# Patient Record
Sex: Female | Born: 1960 | Race: Black or African American | Hispanic: No | Marital: Married | Smoking: Never smoker
Health system: Southern US, Community
[De-identification: ages and names within clinical notes are randomized; demographics above are authoritative.]

## PROBLEM LIST (undated history)

## (undated) DIAGNOSIS — N950 Postmenopausal bleeding: Secondary | ICD-10-CM

## (undated) DIAGNOSIS — R7303 Prediabetes: Secondary | ICD-10-CM

## (undated) DIAGNOSIS — Z973 Presence of spectacles and contact lenses: Secondary | ICD-10-CM

## (undated) DIAGNOSIS — M1990 Unspecified osteoarthritis, unspecified site: Secondary | ICD-10-CM

## (undated) DIAGNOSIS — G8929 Other chronic pain: Secondary | ICD-10-CM

## (undated) DIAGNOSIS — I10 Essential (primary) hypertension: Secondary | ICD-10-CM

## (undated) DIAGNOSIS — D259 Leiomyoma of uterus, unspecified: Secondary | ICD-10-CM

## (undated) DIAGNOSIS — K219 Gastro-esophageal reflux disease without esophagitis: Secondary | ICD-10-CM

## (undated) DIAGNOSIS — T7840XA Allergy, unspecified, initial encounter: Secondary | ICD-10-CM

## (undated) DIAGNOSIS — R9389 Abnormal findings on diagnostic imaging of other specified body structures: Secondary | ICD-10-CM

## (undated) HISTORY — DX: Allergy, unspecified, initial encounter: T78.40XA

---

## 1999-05-12 ENCOUNTER — Other Ambulatory Visit: Admission: RE | Admit: 1999-05-12 | Discharge: 1999-05-12 | Payer: Self-pay | Admitting: Obstetrics and Gynecology

## 1999-06-08 ENCOUNTER — Ambulatory Visit (HOSPITAL_COMMUNITY): Admission: RE | Admit: 1999-06-08 | Discharge: 1999-06-08 | Payer: Self-pay | Admitting: Obstetrics and Gynecology

## 1999-06-08 ENCOUNTER — Encounter: Payer: Self-pay | Admitting: Obstetrics and Gynecology

## 2000-06-05 ENCOUNTER — Other Ambulatory Visit: Admission: RE | Admit: 2000-06-05 | Discharge: 2000-06-05 | Payer: Self-pay | Admitting: Obstetrics and Gynecology

## 2002-01-06 ENCOUNTER — Other Ambulatory Visit: Admission: RE | Admit: 2002-01-06 | Discharge: 2002-01-06 | Payer: Self-pay | Admitting: Obstetrics and Gynecology

## 2003-04-17 ENCOUNTER — Ambulatory Visit (HOSPITAL_COMMUNITY): Admission: RE | Admit: 2003-04-17 | Discharge: 2003-04-17 | Payer: Self-pay | Admitting: Obstetrics and Gynecology

## 2003-04-17 ENCOUNTER — Encounter: Payer: Self-pay | Admitting: Obstetrics and Gynecology

## 2003-04-22 ENCOUNTER — Other Ambulatory Visit: Admission: RE | Admit: 2003-04-22 | Discharge: 2003-04-22 | Payer: Self-pay | Admitting: Obstetrics and Gynecology

## 2003-11-17 ENCOUNTER — Emergency Department (HOSPITAL_COMMUNITY): Admission: AD | Admit: 2003-11-17 | Discharge: 2003-11-17 | Payer: Self-pay | Admitting: Family Medicine

## 2003-11-22 ENCOUNTER — Emergency Department (HOSPITAL_COMMUNITY): Admission: AD | Admit: 2003-11-22 | Discharge: 2003-11-22 | Payer: Self-pay | Admitting: Family Medicine

## 2003-11-24 ENCOUNTER — Emergency Department (HOSPITAL_COMMUNITY): Admission: AD | Admit: 2003-11-24 | Discharge: 2003-11-24 | Payer: Self-pay | Admitting: Family Medicine

## 2004-04-20 ENCOUNTER — Other Ambulatory Visit: Admission: RE | Admit: 2004-04-20 | Discharge: 2004-04-20 | Payer: Self-pay | Admitting: Obstetrics and Gynecology

## 2004-04-27 ENCOUNTER — Ambulatory Visit (HOSPITAL_COMMUNITY): Admission: RE | Admit: 2004-04-27 | Discharge: 2004-04-27 | Payer: Self-pay | Admitting: Obstetrics and Gynecology

## 2005-04-27 ENCOUNTER — Encounter (INDEPENDENT_AMBULATORY_CARE_PROVIDER_SITE_OTHER): Payer: Self-pay | Admitting: *Deleted

## 2005-04-27 ENCOUNTER — Ambulatory Visit (HOSPITAL_COMMUNITY): Admission: RE | Admit: 2005-04-27 | Discharge: 2005-04-27 | Payer: Self-pay | Admitting: Obstetrics and Gynecology

## 2005-05-11 ENCOUNTER — Other Ambulatory Visit: Admission: RE | Admit: 2005-05-11 | Discharge: 2005-05-11 | Payer: Self-pay | Admitting: Obstetrics and Gynecology

## 2005-05-31 ENCOUNTER — Ambulatory Visit (HOSPITAL_COMMUNITY): Admission: RE | Admit: 2005-05-31 | Discharge: 2005-05-31 | Payer: Self-pay | Admitting: Obstetrics and Gynecology

## 2005-08-16 ENCOUNTER — Other Ambulatory Visit: Admission: RE | Admit: 2005-08-16 | Discharge: 2005-08-16 | Payer: Self-pay | Admitting: Obstetrics and Gynecology

## 2006-05-23 ENCOUNTER — Other Ambulatory Visit: Admission: RE | Admit: 2006-05-23 | Discharge: 2006-05-23 | Payer: Self-pay | Admitting: Obstetrics and Gynecology

## 2008-10-27 ENCOUNTER — Ambulatory Visit (HOSPITAL_COMMUNITY): Admission: RE | Admit: 2008-10-27 | Discharge: 2008-10-27 | Payer: Self-pay | Admitting: Obstetrics and Gynecology

## 2010-12-30 ENCOUNTER — Ambulatory Visit (HOSPITAL_COMMUNITY)
Admission: RE | Admit: 2010-12-30 | Discharge: 2010-12-30 | Payer: Self-pay | Source: Home / Self Care | Attending: Obstetrics and Gynecology | Admitting: Obstetrics and Gynecology

## 2011-01-14 ENCOUNTER — Encounter: Payer: Self-pay | Admitting: Obstetrics and Gynecology

## 2011-05-12 NOTE — Op Note (Signed)
NAMEQUINLYNN, CUTHBERT NO.:  [id number]   MEDICAL RECORD NO.:  [id number]          PATIENT TYPE:  AMB   LOCATION:  SDC                           FACILITY:  [facility]   PHYSICIAN:  Janine Limbo, M.D.DATE OF BIRTH:  1961-11-22   DATE OF PROCEDURE:  DATE OF DISCHARGE:                                 OPERATIVE REPORT   PREOPERATIVE DIAGNOSES:  1.  Fibroid uterus.  2.  Menorrhagia.  3.  Endometrial polyps.   POSTOPERATIVE DIAGNOSES:  1.  Fibroid uterus.  2.  Menorrhagia.  3.  Endometrial polyps.   PROCEDURE:  Hysteroscopy with resection of endometrial polyps, dilatation  and curettage.   SURGEON:  Janine Limbo, M.D.   ANESTHETIC:  General.   DISPOSITION:  Ms. Nathanson is a 50 year old female who presents with the  above diagnosis. She understands the indications for procedure and she  accepts the risk of, but not limited to, anesthetic complications, bleeding,  infection, and possible damage to surrounding organs.   FINDINGS:  There was a 12-week size fibroid uterus present. The patient had  several endometrial polyps present. There were no lesions suspicious for  malignancy. No adnexal masses were appreciated.   PROCEDURE:  The patient was taken to the operating room where general  anesthetic was given. The patient's abdomen, perineum, and vagina were  prepped with multiple layers of Betadine. A Foley catheter was placed in the  bladder. Examination under anesthesia was performed. The patient was  sterilely draped. A paracervical block was placed using 10 mL of 0.5%  Marcaine with epinephrine. An endocervical curettage was obtained. The  uterus sounded to 8.5 cm. The cervix was dilated. The diagnostic  hysteroscope was inserted and the cavity was carefully inspected. Pictures  were taken. Findings are mentioned above. The diagnostic hysteroscope was  removed and the cervix was dilated further. The operative hysteroscope was  then inserted. The  single loop resection wire was then used to remove the  polyps. No other suspicious lesions were seen. The cavity was then sharply  curetted. The cavity was felt to be clean at the end of our procedure.  Hemostasis was adequate. Pictures were once again taken to the patient's  endometrial cavity to show that the cavity was clean. All instruments were  then removed. Exam was repeated and the uterus was noted to be firm. Sponge,  needle and instrument counts were correct on two occasions. The estimated  blood loss was 10 mL. The estimated fluid deficit was 50 mL. The patient was  awakened from her anesthetic and taken to the recovery room in stable  condition. The Foley catheter was removed in the operating room.   FOLLOW-UP INSTRUCTIONS:  The patient will use ibuprofen 600 mg every six  hours as needed for pain. She also use Vicodin one or two tablets every four  hours as needed for pain. She will return to see Dr. Stefano Gaul and two to  three weeks of follow-up examination. She was given a copy of the  postoperative instruction sheet as prepared by the [facility] for patients who have undergone  a dilatation and curettage.      AVS/MEDQ  D:  04/27/2005  T:  04/27/2005  Job:  [id number]

## 2011-05-12 NOTE — H&P (Signed)
NAME:  [name], [name] NO.:  [id number]   MEDICAL RECORD NO.:  [id number]          PATIENT TYPE:  AMB   LOCATION:  SDC                           [facility]ACILITY:  [facility]   PHYSICIAN:  Janine Limbo, M.D.DATE OF BIRTH:  1961/02/16   DATE OF ADMISSION:  DATE OF DISCHARGE:                                HISTORY & PHYSICAL   HISTORY OF PRESENT ILLNESS:  Ms. [name] is a 50 year old female gravida 0  who presents for hysteroscopy with dilatation and curettage because of  menorrhagia, fibroids, and an endometrial polyp. The patient had a  hydrosonogram performed and she was found to have an 8.4 mm polyp within the  endometrial cavity. The patient's most recent Pap smear was within normal  limits. The patient's past gynecologic history is significant for a  laparoscopy in 1995 because of pelvic pain. She was found to have  endometriosis. She was also noted to have a fibroid uterus.   PAST MEDICAL HISTORY:  The patient denies hypertension and diabetes.   SOCIAL HISTORY:  The patient denies cigarette use, alcohol use, and  recreational drug use.   DRUG ALLERGIES:  The patient reports that she is allergic to [facility].   REVIEW OF SYSTEMS:  Please see history of present illness.   FAMILY HISTORY:  The patient's mother has had a stroke. There is also a  history of strokes in her grandparents. Hypertension and cerebrovascular  disease are present in her grandparents.   PHYSICAL EXAMINATION:  VITAL SIGNS:  Weight is 167 pounds.  HEENT:  Within normal limits.  CHEST:  Clear.  HEART:  Regular rate and rhythm.  BREASTS:  Without masses.  ABDOMEN:  Nontender.  EXTREMITIES:  Within normal limits  NEUROLOGIC:  Grossly normal.  PELVIC:  External genitalia is normal, the vagina is normal, the cervix is  nontender. The uterus is 8-10 weeks size and nontender, adnexa no masses,  and rectovaginal exam confirms.   ASSESSMENT:  1.  Menorrhagia.  2.  Fibroids.  3.  Endometrial  polyp.   PLAN:  The patient will undergo hysteroscopy with resection of an  endometrial polyp. She will also have a dilatation and curettage. She  understands the indications for her procedure, and she accepts the risks of,  but not limited to, anesthetic complications, bleeding, infections, and  possible damage to the surrounding organs.      AVS/MEDQ  D:  04/26/2005  T:  04/26/2005  Job:  [id number]

## 2012-11-20 ENCOUNTER — Ambulatory Visit: Payer: Self-pay | Admitting: Obstetrics and Gynecology

## 2012-12-02 ENCOUNTER — Other Ambulatory Visit: Payer: Self-pay | Admitting: Obstetrics and Gynecology

## 2012-12-02 DIAGNOSIS — Z1231 Encounter for screening mammogram for malignant neoplasm of breast: Secondary | ICD-10-CM

## 2012-12-20 ENCOUNTER — Ambulatory Visit: Payer: BC Managed Care – PPO

## 2012-12-20 ENCOUNTER — Ambulatory Visit (INDEPENDENT_AMBULATORY_CARE_PROVIDER_SITE_OTHER): Payer: BC Managed Care – PPO | Admitting: Family Medicine

## 2012-12-20 ENCOUNTER — Ambulatory Visit (HOSPITAL_COMMUNITY)
Admission: RE | Admit: 2012-12-20 | Discharge: 2012-12-20 | Disposition: A | Discharge status: Home / Self Care | Payer: BC Managed Care – PPO | Source: Ambulatory Visit | Attending: Obstetrics and Gynecology | Admitting: Obstetrics and Gynecology

## 2012-12-20 VITALS — BP 130/83 | HR 69 | Temp 98.0°F | Resp 16 | Ht 69.0 in | Wt 220.0 lb

## 2012-12-20 DIAGNOSIS — M79671 Pain in right foot: Secondary | ICD-10-CM

## 2012-12-20 DIAGNOSIS — J3489 Other specified disorders of nose and nasal sinuses: Secondary | ICD-10-CM

## 2012-12-20 DIAGNOSIS — M79609 Pain in unspecified limb: Secondary | ICD-10-CM

## 2012-12-20 DIAGNOSIS — Z23 Encounter for immunization: Secondary | ICD-10-CM

## 2012-12-20 DIAGNOSIS — E669 Obesity, unspecified: Secondary | ICD-10-CM | POA: Insufficient documentation

## 2012-12-20 DIAGNOSIS — R0981 Nasal congestion: Secondary | ICD-10-CM

## 2012-12-20 DIAGNOSIS — E785 Hyperlipidemia, unspecified: Secondary | ICD-10-CM

## 2012-12-20 DIAGNOSIS — Z1231 Encounter for screening mammogram for malignant neoplasm of breast: Secondary | ICD-10-CM | POA: Insufficient documentation

## 2012-12-20 LAB — POCT CBC
Granulocyte percent: 48.1 %G (ref 37–80)
HCT, POC: 43.8 % (ref 37.7–47.9)
Hemoglobin: 13.6 g/dL (ref 12.2–16.2)
Lymph, poc: 2.6 (ref 0.6–3.4)
MCH, POC: 28.8 pg (ref 27–31.2)
MCHC: 31.1 g/dL — AB (ref 31.8–35.4)
MCV: 92.7 fL (ref 80–97)
MID (cbc): 0.4 (ref 0–0.9)
MPV: 8.3 fL (ref 0–99.8)
POC Granulocyte: 2.7 (ref 2–6.9)
POC LYMPH PERCENT: 45.2 %L (ref 10–50)
POC MID %: 6.7 %M (ref 0–12)
Platelet Count, POC: 340 10*3/uL (ref 142–424)
RBC: 4.73 M/uL (ref 4.04–5.48)
RDW, POC: 13.7 %
WBC: 5.7 10*3/uL (ref 4.6–10.2)

## 2012-12-20 LAB — COMPREHENSIVE METABOLIC PANEL
ALT: 10 U/L (ref 0–35)
AST: 14 U/L (ref 0–37)
Albumin: 4.2 g/dL (ref 3.5–5.2)
Alkaline Phosphatase: 98 U/L (ref 39–117)
BUN: 24 mg/dL — ABNORMAL HIGH (ref 6–23)
CO2: 29 mEq/L (ref 19–32)
Calcium: 9.4 mg/dL (ref 8.4–10.5)
Chloride: 101 mEq/L (ref 96–112)
Creat: 0.87 mg/dL (ref 0.50–1.10)
Glucose, Bld: 74 mg/dL (ref 70–99)
Potassium: 4.4 mEq/L (ref 3.5–5.3)
Sodium: 139 mEq/L (ref 135–145)
Total Bilirubin: 0.4 mg/dL (ref 0.3–1.2)
Total Protein: 7.1 g/dL (ref 6.0–8.3)

## 2012-12-20 LAB — LIPID PANEL
Cholesterol: 219 mg/dL — ABNORMAL HIGH (ref 0–200)
HDL: 55 mg/dL (ref >39)
LDL Cholesterol: 147 mg/dL — ABNORMAL HIGH (ref 0–99)
Total CHOL/HDL Ratio: 4 Ratio
Triglycerides: 84 mg/dL (ref <150)
VLDL: 17 mg/dL (ref 0–40)

## 2012-12-20 LAB — TSH: TSH: 1.808 u[IU]/mL (ref 0.350–4.500)

## 2012-12-20 MED ORDER — FLUTICASONE PROPIONATE 50 MCG/ACT NA SUSP: 2.0000 | Freq: Every day | NASAL | Status: DC

## 2012-12-20 NOTE — Progress Notes (Signed)
[facility] [address], [place] [address] [phone]  Date:  12/20/2012   Name:  [name]   DOB:  [birth year]   MRN:  [id number]  PCP:  Pcp Not In System    Chief Complaint: Foot Pain and Hyperlipidemia   History of Present Illness:  [name] is a 51 y.o. very pleasant female patient who presents with the following:  Here today for a follow- up appt. She would like a CHL check today.  She has not had any labs yet this year.  Her ophthalmologist was concerned that she should have her CHL checked due to something that she saw in her eyes Fasting today for labs.    She also has a problem with her right foot.  Early in the morning or at the end of the day her right foot hurts.  It gets better when she walks some, but is worse if she stands up again after sitting for awhile.  There is no known injury and her left foot is ok.  No recent changes in her routine, no increase in her exercise or work schedules.  She has tried some OTC arch supports but has not had success   She also needs a refill of her flonase NS which she uses as needed for nasal congestion.  She also has noted some pain in her neck after she wakes up in the morning for some time.  She thinks that she may need a massage- the left side of her neck will hurt and feel stiff   Non- smoker Works 2nd shift  Would like a flu shot today  There is no problem list on file for this patient.   History reviewed. No pertinent past medical history.  History reviewed. No pertinent past surgical history.  History  Substance Use Topics  . Smoking status: Never Smoker   . Smokeless tobacco: Not on file  . Alcohol Use: No    Family History  Problem Relation Age of Onset  . Heart disease Mother   . Stroke Sister     Allergies  Allergen Reactions  . Floxin (Ofloxacin) Rash    Medication list has been reviewed and updated.  No current outpatient prescriptions on file prior to visit.      Review of Systems:  As per HPI- otherwise negative.   Physical Examination: Filed Vitals:   12/20/12 0958  BP: 130/83  Pulse: 69  Temp: 98 F (36.7 C)  Resp: 16   Filed Vitals:   12/20/12 0958  Height: 5\' 9"  (1.753 m)  Weight: 220 lb (99.791 kg)   Body mass index is 32.49 kg/(m^2). Ideal Body Weight: Weight in (lb) to have BMI = 25: 168.9   GEN: WDWN, NAD, Non-toxic, A & O x 3, obese HEENT: Atraumatic, Normocephalic. Neck supple. No masses, No LAD. She has some tightness in her left trapezius muscle.  Normal cervical ROM Ears and Nose: No external deformity. CV: RRR, No M/G/R. No JVD. No thrill. No extra heart sounds. PULM: CTA B, no wheezes, crackles, rhonchi. No retractions. No resp. distress. No accessory muscle use EXTR: No c/c/e NEURO Normal gait.  PSYCH: Normally interactive. Conversant. Not depressed or anxious appearing.  Calm demeanor.  Right foot: no swelling, bruising or heat.  She is tender at the calcaneal insertion of her plantar fascia.  Consistent with plantar fasciitis.  Achilles intact, ankle benign  [facility] reading (PRIMARY) by  Dr. Patsy Lager. Right foot 2V: negative  RIGHT  FOOT - 2 VIEW  Comparison: None.  Findings: Alignment at the Lisfranc joint appears normal on lateral and oblique views. Standard frontal view was not performed.  Plantar calcaneal spur noted. The cause for the patient's right foot pain and swelling is not observed.  IMPRESSION:  1. No acute findings to explain the patient's right foot pain and swelling. 2. Plantar calcaneal spur.   Results for orders placed in visit on 12/20/12  POCT CBC      Component Value Range   WBC 5.7  4.6 - 10.2 K/uL   Lymph, poc 2.6  0.6 - 3.4   POC LYMPH PERCENT 45.2  10 - 50 %L   MID (cbc) 0.4  0 - 0.9   POC MID % 6.7  0 - 12 %M   POC Granulocyte 2.7  2 - 6.9   Granulocyte percent 48.1  37 - 80 %G   RBC 4.73  4.04 - 5.48 M/uL   Hemoglobin 13.6  12.2 - 16.2 g/dL   HCT, POC 84.6  96.2 -  47.9 %   MCV 92.7  80 - 97 fL   MCH, POC 28.8  27 - 31.2 pg   MCHC 31.1 (*) 31.8 - 35.4 g/dL   RDW, POC 95.2     Platelet Count, POC 340  142 - 424 K/uL   MPV 8.3  0 - 99.8 fL    Assessment and Plan: 1. Right foot pain  TSH, POCT CBC, DG Foot 2 Views Right  2. Hyperlipidemia  Comprehensive metabolic panel, POCT CBC, Lipid panel  3. Need for prophylactic vaccination and inoculation against influenza  Flu vaccine greater than or equal to 3yo preservative free IM  4. Obesity    5. Nasal congestion  fluticasone (FLONASE) 50 MCG/ACT nasal spray    Flu shot today.  Await other labs.   Check FLP and will follow- up with results.   Plantar fascitis: she is not ready to think about an injection at this time. Try more conservative measures first (towel stretch, can roll, ice massage, heel cups or padded inserts), but if not better she will give me a call and I can refer her to ortho- alternatively she can call [facility] as she is already a patient there.   Suggested she try a massage and/ or a new pillow for her neck pain- seems due to trapezius tension  Send copy of CHL to K. Hecker at [facility] when available.   Refilled her Wil[facility] Sports, MD

## 2012-12-20 NOTE — Patient Instructions (Addendum)
You have plantar fascitis - try some of the tips below.  Also stretch your foot before you get out of bed in the morning and before rising if you have been sitting for some time.  Rolling the foot over a can can be helpful, and massaging with ice can help.  Please let me know if you are not better soon  Plantar Fasciitis Plantar fasciitis is a common condition that causes foot pain. It is soreness (inflammation) of the band of tough fibrous tissue on the bottom of the foot that runs from the heel bone (calcaneus) to the ball of the foot. The cause of this soreness may be from excessive standing, poor fitting shoes, running on hard surfaces, being overweight, having an abnormal walk, or overuse (this is common in runners) of the painful foot or feet. It is also common in aerobic exercise dancers and ballet dancers. SYMPTOMS  Most people with plantar fasciitis complain of:  Severe pain in the morning on the bottom of their foot especially when taking the first steps out of bed. This pain recedes after a few minutes of walking.  Severe pain is experienced also during walking following a long period of inactivity.  Pain is worse when walking barefoot or up stairs DIAGNOSIS   Your caregiver will diagnose this condition by examining and feeling your foot.  Special tests such as X-rays of your foot, are usually not needed. PREVENTION   Consult a sports medicine professional before beginning a new exercise program.  Walking programs offer a good workout. With walking there is a lower chance of overuse injuries common to runners. There is less impact and less jarring of the joints.  Begin all new exercise programs slowly. If problems or pain develop, decrease the amount of time or distance until you are at a comfortable level.  Wear good shoes and replace them regularly.  Stretch your foot and the heel cords at the back of the ankle (Achilles tendon) both before and after exercise.  Run or  exercise on even surfaces that are not hard. For example, asphalt is better than pavement.  Do not run barefoot on hard surfaces.  If using a treadmill, vary the incline.  Do not continue to workout if you have foot or joint problems. Seek professional help if they do not improve. HOME CARE INSTRUCTIONS   Avoid activities that cause you pain until you recover.  Use ice or cold packs on the problem or painful areas after working out.  Only take over-the-counter or prescription medicines for pain, discomfort, or fever as directed by your caregiver.  Soft shoe inserts or athletic shoes with air or gel sole cushions may be helpful.  If problems continue or become more severe, consult a sports medicine caregiver or your own health care provider. Cortisone is a potent anti-inflammatory medication that may be injected into the painful area. You can discuss this treatment with your caregiver. MAKE SURE YOU:   Understand these instructions.  Will watch your condition.  Will get help right away if you are not doing well or get worse. Document Released: 09/05/2001 Document Revised: 03/04/2012 Document Reviewed: 11/04/2008 [facility] Patient Information 2013 [place], [place].

## 2012-12-21 ENCOUNTER — Encounter: Payer: Self-pay | Admitting: Family Medicine

## 2013-01-09 ENCOUNTER — Ambulatory Visit: Payer: BC Managed Care – PPO | Admitting: Obstetrics and Gynecology

## 2013-01-09 ENCOUNTER — Encounter: Payer: Self-pay | Admitting: Obstetrics and Gynecology

## 2013-01-09 VITALS — BP 128/80 | HR 58 | Ht 70.5 in | Wt 223.0 lb

## 2013-01-09 DIAGNOSIS — Z124 Encounter for screening for malignant neoplasm of cervix: Secondary | ICD-10-CM

## 2013-01-09 DIAGNOSIS — Z01419 Encounter for gynecological examination (general) (routine) without abnormal findings: Secondary | ICD-10-CM

## 2013-01-09 NOTE — Progress Notes (Signed)
Subjective:    [name] is a 52 y.o. female G0P0000 who presents for annual exam. The patient complaints of hot flashes and vaginal dryness.  The following portions of the patient's history were reviewed and updated as appropriate: allergies, current medications, past family history, past medical history, past social history, past surgical history and problem list.  Review of Systems Pertinent items are noted in HPI. Gastrointestinal:No change in bowel habits, no abdominal pain, no rectal bleeding Genitourinary:negative for dysuria, frequency, hematuria, nocturia and urinary incontinence    Objective:     BP 128/80  Pulse 58  Ht 5' 10.5" (1.791 m)  Wt 223 lb (101.152 kg)  BMI 31.54 kg/m2  Weight:  Wt Readings from Last 1 Encounters:  01/09/13 223 lb (101.152 kg)     BMI: Body mass index is 31.54 kg/(m^2). General Appearance: Alert, appropriate appearance for age. No acute distress HEENT: Grossly normal Neck / Thyroid: Supple, no masses, nodes or enlargement Lungs: clear to auscultation bilaterally Back: No CVA tenderness Breast Exam: No masses or nodes.No dimpling, nipple retraction or discharge. Cardiovascular: Regular rate and rhythm. S1, S2, no murmur Gastrointestinal: Soft, non-tender, no masses or organomegaly  ++++++++++++++++++++++++++++++++++++++++++++++++++++++++  Pelvic Exam: External genitalia: normal general appearance Vaginal: normal without tenderness, induration or masses and relaxation noted Cervix: normal appearance Adnexa: normal bimanual exam Uterus: normal size and shape Rectovaginal: normal rectal, no masses  ++++++++++++++++++++++++++++++++++++++++++++++++++++++++  Lymphatic Exam: Non-palpable nodes in neck, clavicular, axillary, or inguinal regions  Psychiatric: Alert and oriented, appropriate affect.      Assessment:    Normal gyn exam   Overweight or obese: Yes  Pelvic relaxation: Yes  Menopausal symptoms: Yes. Severe:  No.  History of CIN-1 and HPV   Plan:    Mammogram. Pap smear.   Follow-up:  for annual exam  Menopause discussed.  Handout given about herbal medications.  The updated Pap smear screening guidelines were discussed with the patient. The patient requested that I obtain a Pap smear: Yes.  Kegel exercises discussed: Yes.  Proper diet and regular exercise were reviewed.  Annual mammograms recommended starting at age 7. Proper breast care was discussed.  Screening colonoscopy is recommended beginning at age 64.  Regular health maintenance was reviewed.  Sleep hygiene was discussed.  Adequate calcium and vitamin D intake was emphasized.  Leonard Schwartz M.D.   Regular Periods: no Mammogram: yes  Monthly Breast Ex.: yes Exercise: no  Tetanus < 10 years: yes Seatbelts: yes  NI. 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 Dallas Schimke  PMH: no changes  FMH:  No changes   Last Bone Scan: n/a

## 2013-01-10 LAB — PAP IG W/ RFLX HPV ASCU

## 2013-01-13 LAB — HUMAN PAPILLOMAVIRUS, HIGH RISK: HPV DNA High Risk: NOT DETECTED

## 2013-01-16 NOTE — Progress Notes (Signed)
Quick Note:  Send atypical Pap smear letter with a note to return for repeat Pap in 12 months. ______ 

## 2013-01-17 ENCOUNTER — Encounter: Payer: Self-pay | Admitting: Obstetrics and Gynecology

## 2013-02-19 ENCOUNTER — Telehealth: Payer: Self-pay

## 2013-02-19 NOTE — Telephone Encounter (Signed)
PT STATES SHE HAVE AN APPT WITH THE ORTH NEXT WEEK AND NEED TO COME AND P/U HER XRAYS PLEASE CALL [phone]. OTHERWISE SHE WILL BE HERE Friday TO P/U

## 2013-02-19 NOTE — Telephone Encounter (Signed)
Request to xray

## 2013-07-01 ENCOUNTER — Ambulatory Visit (INDEPENDENT_AMBULATORY_CARE_PROVIDER_SITE_OTHER): Payer: BC Managed Care – PPO | Admitting: Family Medicine

## 2013-07-01 VITALS — BP 140/82 | HR 65 | Temp 97.9°F | Resp 18 | Wt 229.0 lb

## 2013-07-01 DIAGNOSIS — R05 Cough: Secondary | ICD-10-CM

## 2013-07-01 DIAGNOSIS — R059 Cough, unspecified: Secondary | ICD-10-CM

## 2013-07-01 DIAGNOSIS — J069 Acute upper respiratory infection, unspecified: Secondary | ICD-10-CM

## 2013-07-01 DIAGNOSIS — R197 Diarrhea, unspecified: Secondary | ICD-10-CM

## 2013-07-01 LAB — POCT CBC
Granulocyte percent: 51.4 %G (ref 37–80)
HCT, POC: 40.1 % (ref 37.7–47.9)
Hemoglobin: 12.9 g/dL (ref 12.2–16.2)
Lymph, poc: 1.9 (ref 0.6–3.4)
MCH, POC: 29.5 pg (ref 27–31.2)
MCHC: 32.2 g/dL (ref 31.8–35.4)
MCV: 91.6 fL (ref 80–97)
MID (cbc): 0.3 (ref 0–0.9)
MPV: 7.1 fL (ref 0–99.8)
POC Granulocyte: 2.3 (ref 2–6.9)
POC LYMPH PERCENT: 41.4 %L (ref 10–50)
POC MID %: 7.2 %M (ref 0–12)
Platelet Count, POC: 297 10*3/uL (ref 142–424)
RBC: 4.38 M/uL (ref 4.04–5.48)
RDW, POC: 13.2 %
WBC: 4.5 10*3/uL — AB (ref 4.6–10.2)

## 2013-07-01 MED ORDER — IPRATROPIUM BROMIDE 0.03 % NA SOLN: 2.0000 | Freq: Four times a day (QID) | NASAL | Status: DC

## 2013-07-01 MED ORDER — HYDROCODONE-HOMATROPINE 5-1.5 MG/5ML PO SYRP: 5.0000 mL | ORAL_SOLUTION | Freq: Three times a day (TID) | ORAL | Status: DC | PRN

## 2013-07-01 NOTE — Patient Instructions (Addendum)
Use the atrovent nasal spray for drainage.  Use the hycodan cough syrup as needed- remember it will make you sleepy so do not use when you need to drive.    Rest and drink plenty of fluids.  Let me know if you are not better in the next 2 or 3 days- Sooner if worse.

## 2013-07-01 NOTE — Progress Notes (Signed)
[facility] [address], [place] [address] [phone]  Date:  07/01/2013   Name:  [name]   DOB:  01-17-1961   MRN:  [id number]  PCP:  Pcp Not In System    Chief Complaint: URI, Cough and Sinusitis   History of Present Illness:  [name] is a 52 y.o. very pleasant female patient who presents with the following:  She is here today with URI type symptoms.  She has noted cough, sneezing, some nose bleeds from the left side only, and diarrhea. The nosebleeds will resolve in 5 to 10 minutes.  For the last 3 days she has had a few runny stools.  She had 3 BM yesterday, 2 so far today.   Her sx have gone on for about 4 days so far.   She has been on medical leave for plantar fascitis, and is supposed to RTW today.   She is having a hard time sleeping due to cough She is using an OTC cold and flu medication.    She has not noted a fever.   She has sinus pain and pressure.   She notes PND.  The cough is generally dry.   No abdominal pain or vomiting.    Patient Active Problem List   Diagnosis Date Noted  . Obesity 12/20/2012    Past Medical History  Diagnosis Date  . Allergy     History reviewed. No pertinent past surgical history.  History  Substance Use Topics  . Smoking status: Never Smoker   . Smokeless tobacco: Not on file  . Alcohol Use: No    Family History  Problem Relation Age of Onset  . Heart disease Mother   . Stroke Sister     Allergies  Allergen Reactions  . Floxin (Ofloxacin) Rash    Medication list has been reviewed and updated.  Current Outpatient Prescriptions on File Prior to Visit  Medication Sig Dispense Refill  . fluticasone (FLONASE) 50 MCG/ACT nasal spray Place 2 sprays into the nose daily.  16 g  6   No current facility-administered medications on file prior to visit.    Review of Systems:  As per HPI- otherwise negative. No fever  Physical Examination: Filed Vitals:   07/01/13 1253   BP: 140/82  Pulse: 65  Temp: 97.9 F (36.6 C)  Resp: 18   Filed Vitals:   07/01/13 1253  Weight: 229 lb (103.874 kg)   Body mass index is 32.38 kg/(m^2). Ideal Body Weight:    GEN: WDWN, NAD, Non-toxic, A & O x 3, looks well but is congested HEENT: Atraumatic, Normocephalic. Neck supple. No masses, No LAD.  Bilateral TM wnl, oropharynx normal.  PEERL,EOMI.   Sinuses are tender to percussion.  Nasal cavity wnl Ears and Nose: No external deformity. CV: RRR, No M/G/R. No JVD. No thrill. No extra heart sounds. PULM: CTA B, no wheezes, crackles, rhonchi. No retractions. No resp. distress. No accessory muscle use. ABD: S, NT, ND, +BS. No rebound. No HSM. EXTR: No c/c/e NEURO Normal gait.  PSYCH: Normally interactive. Conversant. Not depressed or anxious appearing.  Calm demeanor.   Results for orders placed in visit on 07/01/13  POCT CBC      Result Value Range   WBC 4.5 (*) 4.6 - 10.2 K/uL   Lymph, poc 1.9  0.6 - 3.4   POC LYMPH PERCENT 41.4  10 - 50 %L   MID (cbc) 0.3  0 - 0.9  POC MID % 7.2  0 - 12 %M   POC Granulocyte 2.3  2 - 6.9   Granulocyte percent 51.4  37 - 80 %G   RBC 4.38  4.04 - 5.48 M/uL   Hemoglobin 12.9  12.2 - 16.2 g/dL   HCT, POC 16.1  09.6 - 47.9 %   MCV 91.6  80 - 97 fL   MCH, POC 29.5  27 - 31.2 pg   MCHC 32.2  31.8 - 35.4 g/dL   RDW, POC 04.5     Platelet Count, POC 297  142 - 424 K/uL   MPV 7.1  0 - 99.8 fL     Assessment and Plan: Diarrhea - Plan: POCT CBC  Upper respiratory infection - Plan: POCT CBC, ipratropium (ATROVENT) 0.03 % nasal spray  Cough - Plan: HYDROcodone-homatropine (HYCODAN) 5-1.5 MG/5ML syrup  Likely viral URI. As she currently has some diarrhea I am even less inclined to use abx.  Discussed that abx will likely worsen her diarrhea.  For now she will use atrovent nasal and hycodan as needed for cough.  Gave her a note for work.  She is to let me know if not better soon.    Signed Abbe Amsterdam, MD

## 2014-02-03 ENCOUNTER — Other Ambulatory Visit: Payer: Self-pay | Admitting: Obstetrics and Gynecology

## 2014-02-03 DIAGNOSIS — Z1231 Encounter for screening mammogram for malignant neoplasm of breast: Secondary | ICD-10-CM

## 2014-02-10 ENCOUNTER — Ambulatory Visit (HOSPITAL_COMMUNITY): Payer: BC Managed Care – PPO | Attending: Obstetrics and Gynecology

## 2014-04-06 ENCOUNTER — Ambulatory Visit: Payer: BC Managed Care – PPO | Admitting: Family Medicine

## 2014-04-13 ENCOUNTER — Encounter: Payer: BC Managed Care – PPO | Admitting: Family Medicine

## 2014-05-14 IMAGING — CR DG FOOT 2V*R*
2 series · 2 of 2 positions shown · non-contrast
Comparison: None.

CLINICAL DATA: Right foot pain and swelling.

RIGHT FOOT - 2 VIEW

[AP]
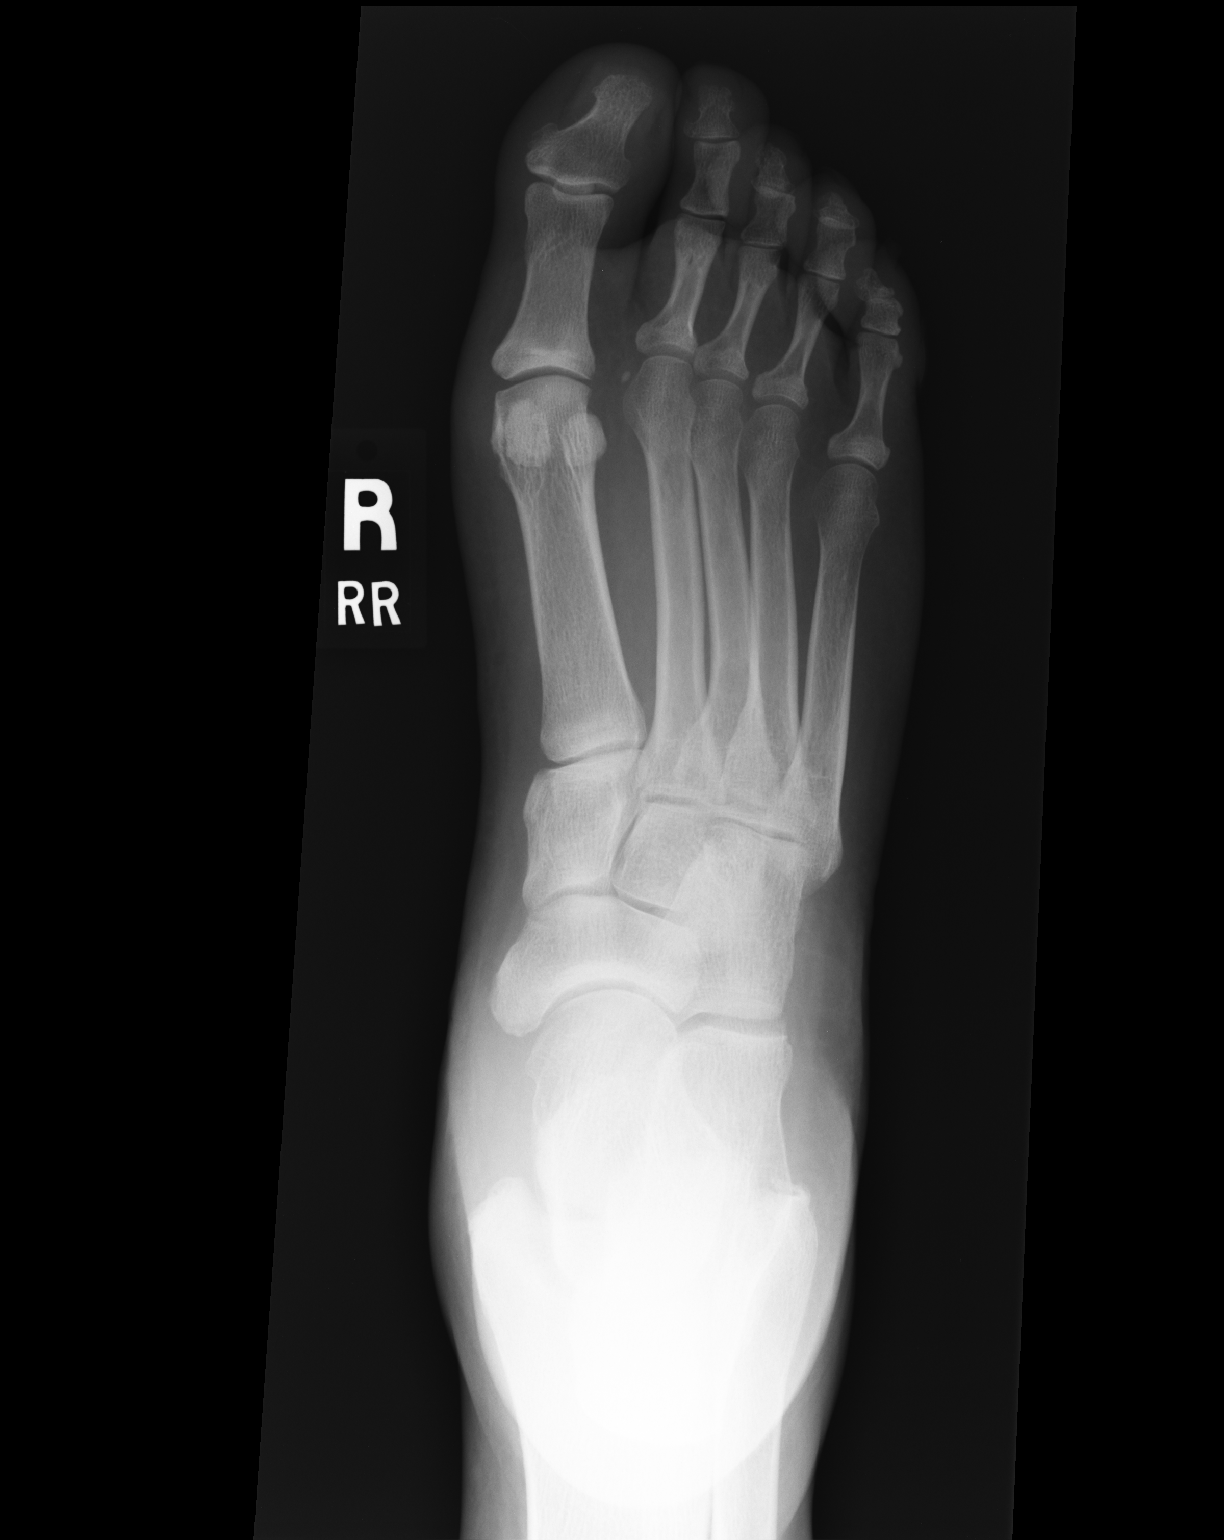

[lateral]
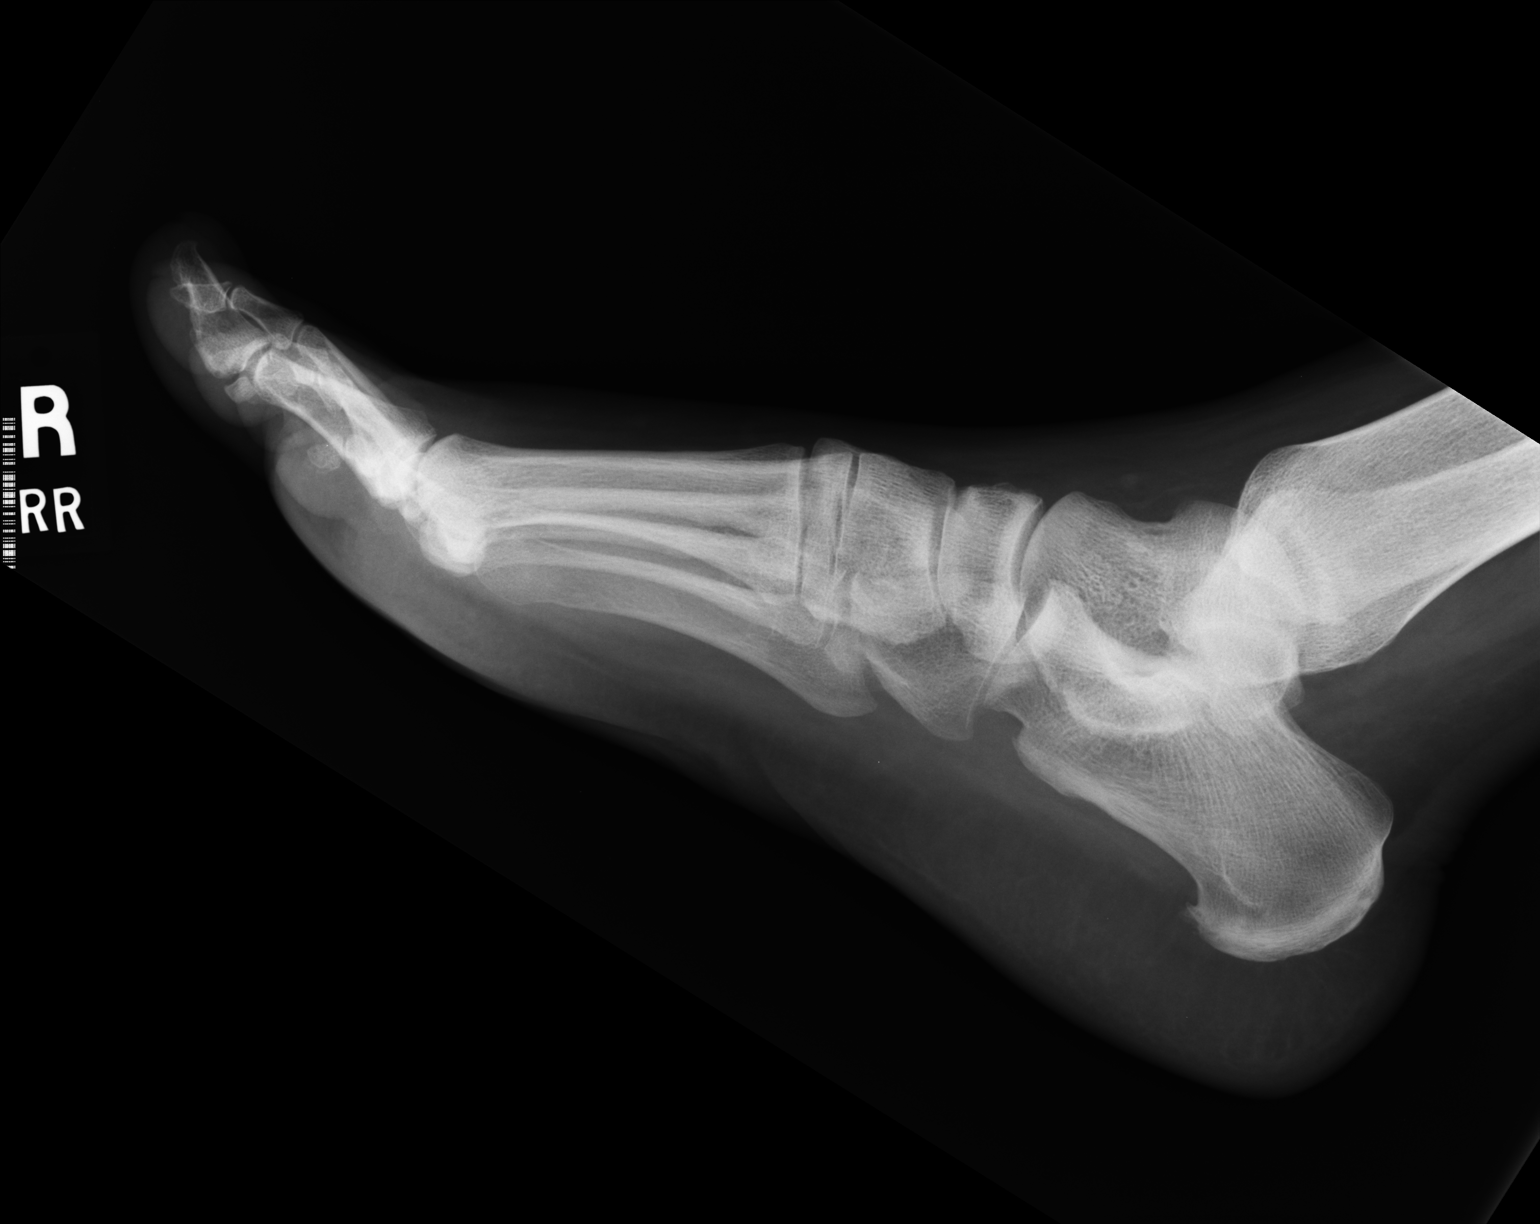

[2 of 2 positions shown; findings below may reference images not displayed]

FINDINGS: Alignment at the Lisfranc joint appears normal on lateral
and oblique views.  Standard frontal view was not performed.

Plantar calcaneal spur noted.  The cause for the patient's right
foot pain and swelling is not observed.
IMPRESSION: 1.  No acute findings to explain the patient's right foot pain and
swelling.
2.  Plantar calcaneal spur.

## 2014-07-06 ENCOUNTER — Ambulatory Visit (INDEPENDENT_AMBULATORY_CARE_PROVIDER_SITE_OTHER): Payer: BC Managed Care – PPO

## 2014-07-06 ENCOUNTER — Ambulatory Visit (INDEPENDENT_AMBULATORY_CARE_PROVIDER_SITE_OTHER): Payer: BC Managed Care – PPO | Admitting: Family Medicine

## 2014-07-06 VITALS — BP 118/72 | HR 65 | Temp 97.8°F | Resp 16 | Ht 69.0 in | Wt 227.0 lb

## 2014-07-06 DIAGNOSIS — M25551 Pain in right hip: Secondary | ICD-10-CM

## 2014-07-06 DIAGNOSIS — M25559 Pain in unspecified hip: Secondary | ICD-10-CM

## 2014-07-06 MED ORDER — CYCLOBENZAPRINE HCL 10 MG PO TABS
10.0000 mg | ORAL_TABLET | Freq: Every day | ORAL | Status: DC
Start: 2014-07-06 — End: 2014-10-05

## 2014-07-06 MED ORDER — MELOXICAM 15 MG PO TABS: 15.0000 mg | ORAL_TABLET | Freq: Every day | ORAL | Status: DC

## 2014-07-06 NOTE — Patient Instructions (Signed)
Moist heat as needed for pain Muscle relaxer at bedtime Meloxicam once a day- don't take other NSAIDs (alleve, ibuprofen) Gentle range of motion exercises 2x a day.

## 2014-07-06 NOTE — Progress Notes (Signed)
   Subjective:    Patient ID: [name], female    DOB: 1961-09-16, 53 y.o.   MRN: [id number]  HPI Patient presents today with right hip and leg pain. It started 4 days ago. No injury. Pain starts in hip and hurts for 20 minutes or so. Pain very sharp and radiates to below knee. Now lower back is hurting. Helped to elevate leg for awhile. Has taken ibuprofen 4 tablets every 6 hours. Seemed to help at first, then not as much.   Has difficulty staying in one position for very long. Was not able to sleep the last 2 nights.  No previous back or hip problems. Had right groin strain 20 years ago. Has had intermittent problems with plantar fasciatus. 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 She is moving and standing during her shift. She wears steel-toed boots.   Review of Systems No falls, no injury, has been limping, no fever/chills, no swelling, warmth or redness.    Objective:   Physical Exam  Vitals reviewed. Constitutional: She is oriented to person, place, and time. She appears well-developed and well-nourished.  HENT:  Head: Normocephalic and atraumatic.  Eyes: Conjunctivae are normal. Right eye exhibits no discharge. Left eye exhibits no discharge.  Neck: Normal range of motion. Neck supple.  Cardiovascular: Normal rate.   Pulmonary/Chest: Effort normal.  Musculoskeletal: She exhibits no edema.       Right hip: She exhibits decreased range of motion, tenderness and bony tenderness. She exhibits normal strength, no swelling, no crepitus, no deformity and no laceration.  Pain worse with abduction/external rotation.  Neurological: She is alert and oriented to person, place, and time.  Skin: Skin is warm and dry.  Psychiatric: She has a normal mood and affect. Her behavior is normal. Judgment and thought content normal.   [facility] reading (PRIMARY) by  Dr. Neva SeatGreene, mild degenerative changes, no fracture.     Assessment & Plan:  Discussed with  Dr. Neva SeatGreene 1. Right hip pain - DG Hip Complete Right; Future - cyclobenzaprine (FLEXERIL) 10 MG tablet; Take 1 tablet (10 mg total) by mouth at bedtime.  Dispense: 30 tablet; Refill: 0 - meloxicam (MOBIC) 15 MG tablet; Take 1 tablet (15 mg total) by mouth daily.  Dispense: 30 tablet; Refill: 0 - Encouraged her to use heat to affected area TID PRN, perform gentle ROM -RTC if worsening symptoms or if no improvement in 7-10 days.  Emi Belfasteborah B. Toris Laverdiere, FNP-BC  [facility], [facility]  07/06/2014 10:56 PM

## 2014-07-07 ENCOUNTER — Telehealth: Payer: Self-pay

## 2014-07-07 ENCOUNTER — Encounter: Payer: Self-pay | Admitting: Family Medicine

## 2014-07-07 DIAGNOSIS — M25551 Pain in right hip: Secondary | ICD-10-CM

## 2014-07-07 MED ORDER — TRAMADOL HCL 50 MG PO TABS: 50.0000 mg | ORAL_TABLET | Freq: Three times a day (TID) | ORAL | Status: DC | PRN

## 2014-07-07 NOTE — Telephone Encounter (Signed)
Pt is requesting something for her pain- She would like something like Tramadol sent to her pharmacy. [facility] [address]   I have written another letter excusing her from work 07/07/14.

## 2014-07-07 NOTE — Telephone Encounter (Signed)
Called patient. Still having hip pain, feels like spasm, lasts for several minutes. Has only taken flexeril once. I will leave her an Ultram prescription at front desk and OOW note. If no improvement by end of week- refer to ortho.

## 2014-07-07 NOTE — Telephone Encounter (Signed)
PT WAS TOLD IF SHE NEEDED ANOTHER OOW NOTE FOR TODAY ALSO WE WOULD GIVE IT TO HER AND SHE DOES, ALSO THE MEDICATION SHE WAS GIVEN ISN'T HELPING PLEASE CALL [phone]    [facility] [address]

## 2014-07-09 ENCOUNTER — Telehealth: Payer: Self-pay

## 2014-07-09 DIAGNOSIS — M25551 Pain in right hip: Secondary | ICD-10-CM

## 2014-07-09 DIAGNOSIS — M25552 Pain in left hip: Secondary | ICD-10-CM

## 2014-07-09 NOTE — Telephone Encounter (Signed)
Spoke with patient who reports that she is getting better, she has pain when she stands up occasionally, but is much less. Was unable to cross legs, but is now able to cross legs. Denies rash or redness. Is using heat at night, which helps. She is not sure if she will be able to go to work on Saturday. She has already called out for tomorrow (Friday) which will be her 3 day maximum before FMLA will need to be done. She will let me know. Encouraged her that she is having improvement and it may be slower than she would like, but that it is moving in the right direction.

## 2014-07-09 NOTE — Telephone Encounter (Signed)
Per telephone call on 7/14- refer to ortho if no improvement by the end of the week.  Referral entered.

## 2014-07-09 NOTE — Telephone Encounter (Signed)
Pt would like to speak with Leone PayorGessner about possibly be referred out because she is still in a lot of pain, please advise pt

## 2014-07-09 NOTE — Telephone Encounter (Signed)
PATIENT RECENTLY HAD A VISIT FOR HER LEG AND IS CALLING BACK TO SPEAK TO DEBBIE GESSNER ABOUT GETTING A REFERRAL TO SEE A SPECIALIST BECAUSE HER LEG ISN'T GETTING BETTER. PLEASE CALL! PATIENT PHONE:[phone]

## 2014-07-09 NOTE — Telephone Encounter (Signed)
Gavin PoundDeborah, can you please call pt tonight if you get a chance per pt's request. Pt called back because she is in a lot of pain. Told her that we placed the referral for her this morning, but that she prob wouldn't see ortho until next week. She wants to go back to work but feels she can't.

## 2014-07-10 ENCOUNTER — Encounter: Payer: Self-pay | Admitting: Family Medicine

## 2014-07-10 NOTE — Telephone Encounter (Signed)
Vivia Ewingeborah G.   Patients husband called to say they are expecting a call back from you.  Patients legs hurt her when she stands up.  Okay while laying down.  [phone]

## 2014-07-10 NOTE — Telephone Encounter (Signed)
Called patient who reports that her leg pain is improved with medication. She did some ROM yesterday and slept much better last night. Heat helps. Was better yesterday. Thinks she might have over done it with activity yesterday. Is requesting work excuse for tomorrow and Sunday. An order has been put in for ortho referral.

## 2014-07-21 ENCOUNTER — Other Ambulatory Visit (HOSPITAL_COMMUNITY): Payer: Self-pay | Admitting: Obstetrics and Gynecology

## 2014-07-21 DIAGNOSIS — Z1231 Encounter for screening mammogram for malignant neoplasm of breast: Secondary | ICD-10-CM

## 2014-07-22 ENCOUNTER — Telehealth: Payer: Self-pay | Admitting: Family Medicine

## 2014-07-22 ENCOUNTER — Ambulatory Visit (HOSPITAL_COMMUNITY)
Admission: RE | Admit: 2014-07-22 | Discharge: 2014-07-22 | Disposition: A | Discharge status: Home / Self Care | Payer: BC Managed Care – PPO | Source: Ambulatory Visit | Attending: Obstetrics and Gynecology | Admitting: Obstetrics and Gynecology

## 2014-07-22 DIAGNOSIS — Z1231 Encounter for screening mammogram for malignant neoplasm of breast: Secondary | ICD-10-CM | POA: Diagnosis present

## 2014-07-22 NOTE — Telephone Encounter (Signed)
Patient OOW note states she can return to work 07/15/14 and she states it should be 07/16/14. Please advise.

## 2014-07-22 NOTE — Telephone Encounter (Signed)
That will be fine for her to have a note OOW until 7/23.

## 2014-07-23 NOTE — Telephone Encounter (Signed)
Spoke to pt, she is aware note is complete and will be waiting at the front desk for her.

## 2014-07-24 ENCOUNTER — Telehealth: Payer: Self-pay

## 2014-07-24 ENCOUNTER — Other Ambulatory Visit: Payer: Self-pay | Admitting: Obstetrics and Gynecology

## 2014-07-24 DIAGNOSIS — R928 Other abnormal and inconclusive findings on diagnostic imaging of breast: Secondary | ICD-10-CM

## 2014-07-24 NOTE — Telephone Encounter (Signed)
Pt FMLA PPW put in Dr. Paralee CancelGreene's box for completion in 5-7 business days

## 2014-07-31 ENCOUNTER — Ambulatory Visit
Admission: RE | Admit: 2014-07-31 | Discharge: 2014-07-31 | Disposition: A | Discharge status: Home / Self Care | Payer: BC Managed Care – PPO | Source: Ambulatory Visit | Attending: Obstetrics and Gynecology | Admitting: Obstetrics and Gynecology

## 2014-07-31 DIAGNOSIS — R928 Other abnormal and inconclusive findings on diagnostic imaging of breast: Secondary | ICD-10-CM

## 2014-08-24 ENCOUNTER — Other Ambulatory Visit: Payer: Self-pay | Admitting: Obstetrics and Gynecology

## 2014-09-21 LAB — HM COLONOSCOPY

## 2014-10-05 ENCOUNTER — Ambulatory Visit (INDEPENDENT_AMBULATORY_CARE_PROVIDER_SITE_OTHER): Payer: BC Managed Care – PPO | Admitting: Family Medicine

## 2014-10-05 VITALS — BP 110/74 | HR 85 | Temp 100.3°F | Ht 69.5 in | Wt 213.2 lb

## 2014-10-05 DIAGNOSIS — R509 Fever, unspecified: Secondary | ICD-10-CM

## 2014-10-05 DIAGNOSIS — K121 Other forms of stomatitis: Secondary | ICD-10-CM

## 2014-10-05 DIAGNOSIS — Z23 Encounter for immunization: Secondary | ICD-10-CM

## 2014-10-05 DIAGNOSIS — J111 Influenza due to unidentified influenza virus with other respiratory manifestations: Secondary | ICD-10-CM

## 2014-10-05 LAB — POCT CBC
Granulocyte percent: 45.2 %G (ref 37–80)
HCT, POC: 38.7 % (ref 37.7–47.9)
Hemoglobin: 12.7 g/dL (ref 12.2–16.2)
Lymph, poc: 1.7 (ref 0.6–3.4)
MCH, POC: 28.7 pg (ref 27–31.2)
MCHC: 32.9 g/dL (ref 31.8–35.4)
MCV: 87.1 fL (ref 80–97)
MID (cbc): 0.3 (ref 0–0.9)
MPV: 6.9 fL (ref 0–99.8)
POC Granulocyte: 1.7 — AB (ref 2–6.9)
POC LYMPH PERCENT: 45.8 %L (ref 10–50)
POC MID %: 9 %M (ref 0–12)
Platelet Count, POC: 326 10*3/uL (ref 142–424)
RBC: 4.44 M/uL (ref 4.04–5.48)
RDW, POC: 13.5 %
WBC: 3.8 10*3/uL — AB (ref 4.6–10.2)

## 2014-10-05 LAB — POCT INFLUENZA A/B
Influenza A, POC: NEGATIVE
Influenza B, POC: NEGATIVE

## 2014-10-05 MED ORDER — ONDANSETRON 4 MG PO TBDP
4.0000 mg | ORAL_TABLET | Freq: Once | ORAL | Status: AC
  Administered 2014-10-05: 4 mg via ORAL

## 2014-10-05 MED ORDER — HYDROCOD POLST-CHLORPHEN POLST 10-8 MG/5ML PO LQCR: 5.0000 mL | Freq: Two times a day (BID) | ORAL | Status: DC | PRN

## 2014-10-05 MED ORDER — FIRST-DUKES MOUTHWASH MT SUSP: 5.0000 mL | OROMUCOSAL | Status: DC | PRN

## 2014-10-05 MED ORDER — PROMETHAZINE HCL 25 MG PO TABS: 25.0000 mg | ORAL_TABLET | Freq: Three times a day (TID) | ORAL | Status: DC | PRN

## 2014-10-05 MED ORDER — ONDANSETRON 4 MG PO TBDP
8.0000 mg | ORAL_TABLET | Freq: Once | ORAL | Status: AC
  Administered 2014-10-05: 8 mg via ORAL

## 2014-10-05 MED ORDER — GUAIFENESIN ER 1200 MG PO TB12: 1.0000 | ORAL_TABLET | Freq: Two times a day (BID) | ORAL | Status: DC | PRN

## 2014-10-05 NOTE — Progress Notes (Signed)
Subjective:  This chart was scribed for Norberto SorensonEva Salathiel Ferrara, MD by Evon Slackerrance Branch, ED Scribe. This Patient was seen in room 12 and the patients care was started at 7:54 PM   Patient ID: [name], female    DOB: 05-Dec-1961, 53 y.o.   MRN: [id number]  Chief Complaint  Patient presents with  . Chills    x5 days  . Fever    up to 101.8 x5 days  . Cough    not coughing up anything x5 days  . Nausea    not been able to eat x5 days    Fever  Associated symptoms include coughing, headaches and nausea.  Cough Associated symptoms include chills, a fever and headaches.   HPI Comments: [name] is a 53 y.o. female who [facility] complaining of fever onset 5 days prior. She states she had a max temp of 101.8 at home. She states she has associated chills, non productive cough, and nausea. She states she noticed dizziness and headache over the past day.  She states she has tried ibuprofen and alka selsa cold with no relief. She states she decreased appetite and decreased urine today.   She is also complaining of mouth/ palate pain.   Past Medical History  Diagnosis Date  . Allergy    Current Outpatient Prescriptions on File Prior to Visit  Medication Sig Dispense Refill  . fluticasone (FLONASE) 50 MCG/ACT nasal spray Place 2 sprays into the nose daily.  16 g  6  . meloxicam (MOBIC) 15 MG tablet Take 1 tablet (15 mg total) by mouth daily.  30 tablet  0   No current facility-administered medications on file prior to visit.   Allergies  Allergen Reactions  . Floxin [Ofloxacin] Rash     Review of Systems  Constitutional: Positive for fever, chills and appetite change.  Respiratory: Positive for cough.   Gastrointestinal: Positive for nausea.  Genitourinary: Positive for decreased urine volume.  Neurological: Positive for dizziness and headaches.    Objective:   BP 110/74  Pulse 85  Temp(Src) 100.3 F (37.9 C) (Oral)  Ht 5' 9.5" (1.765  m)  Wt 213 lb 3.2 oz (96.707 kg)  BMI 31.04 kg/m2  SpO2 98%   Physical Exam  Nursing note and vitals reviewed. Constitutional: She is oriented to person, place, and time. She appears well-developed and well-nourished. No distress.  HENT:  Head: Normocephalic and atraumatic.  Right Ear: Tympanic membrane is injected. A middle ear effusion is present.  Left Ear: Tympanic membrane is injected and retracted.  Nose: Nose normal.  Mouth/Throat: Oropharynx is clear and moist. No oropharyngeal exudate, posterior oropharyngeal edema or posterior oropharyngeal erythema.  Ulceration over right half of proximal roof of mouth approximately 10-15 small pinpoint shallow poorly defined ulcers. pinpoint ulcerations to side of tongue.   Eyes: Conjunctivae and EOM are normal.  Neck: Neck supple. No tracheal deviation present.  Cardiovascular: Regular rhythm, S1 normal and S2 normal.  Tachycardia present.   Pulmonary/Chest: Effort normal. No respiratory distress.  Musculoskeletal: Normal range of motion.  Lymphadenopathy:       Head (right side): Tonsillar adenopathy present.       Head (left side): Tonsillar adenopathy present.  Neurological: She is alert and oriented to person, place, and time.  Skin: Skin is warm and dry.  Psychiatric: She has a normal mood and affect. Her behavior is normal.     Assessment & Plan:   Fever, unspecified fever cause -  Plan: ondansetron (ZOFRAN-ODT) disintegrating tablet 4 mg, POCT CBC, POCT Influenza A/B, HSV(herpes simplex vrs) 1+2 ab-IgG, Comprehensive metabolic panel, Herpes simplex virus culture, ondansetron (ZOFRAN-ODT) disintegrating tablet 8 mg  Oral ulceration - Plan: ondansetron (ZOFRAN-ODT) disintegrating tablet 4 mg, POCT CBC, POCT Influenza A/B, HSV(herpes simplex vrs) 1+2 ab-IgG, Comprehensive metabolic panel, Herpes simplex virus culture, ondansetron (ZOFRAN-ODT) disintegrating tablet 8 mg - likely apthous ulcers but viral clx sent - try salt water  gargles and magic mouthwash  Influenza - flu test neg but pt w/ all sxs and labs otherwise c/w influenza-like viral illness. rec pushing fluids and rest.  Meds ordered this encounter  Medications  . ondansetron (ZOFRAN-ODT) disintegrating tablet 4 mg    Sig:   . Diphenhyd-Hydrocort-Nystatin (FIRST-DUKES MOUTHWASH) SUSP    Sig: Use as directed 5 mLs in the mouth or throat every 2 (two) hours as needed (sore throat).    Dispense:  237 mL    Refill:  0    Please mix all in a 1:1:1:1 ratio with viscous lidocaine as well.  . promethazine (PHENERGAN) 25 MG tablet    Sig: Take 1 tablet (25 mg total) by mouth every 8 (eight) hours as needed for nausea or vomiting.    Dispense:  20 tablet    Refill:  0  . Guaifenesin (MUCINEX MAXIMUM STRENGTH) 1200 MG TB12    Sig: Take 1 tablet (1,200 mg total) by mouth every 12 (twelve) hours as needed.    Dispense:  14 tablet    Refill:  1  . chlorpheniramine-HYDROcodone (TUSSIONEX PENNKINETIC ER) 10-8 MG/5ML LQCR    Sig: Take 5 mLs by mouth every 12 (twelve) hours as needed.    Dispense:  90 mL    Refill:  0  . ondansetron (ZOFRAN-ODT) disintegrating tablet 8 mg    Sig:     I personally performed the services described in this documentation, which was scribed in my presence. The recorded information has been reviewed and considered, and addended by me as needed.  Norberto SorensonEva Angelys Yetman, MD MPH   Results for orders placed in visit on 10/05/14  HSV(HERPES SIMPLEX VRS) I + II AB-IGG      Result Value Ref Range   HSV 1 Glycoprotein G Ab, IgG       HSV 2 Glycoprotein G Ab, IgG      COMPREHENSIVE METABOLIC PANEL      Result Value Ref Range   Sodium 136  135 - 145 mEq/L   Potassium 4.2  3.5 - 5.3 mEq/L   Chloride 100  96 - 112 mEq/L   CO2 27  19 - 32 mEq/L   Glucose, Bld 85  70 - 99 mg/dL   BUN 14  6 - 23 mg/dL   Creat 1.610.79  0.960.50 - 0.451.10 mg/dL   Total Bilirubin 0.5  0.2 - 1.2 mg/dL   Alkaline Phosphatase 65  39 - 117 U/L   AST 25  0 - 37 U/L   ALT 28  0 - 35  U/L   Total Protein 7.4  6.0 - 8.3 g/dL   Albumin 3.6  3.5 - 5.2 g/dL   Calcium 8.8  8.4 - 40.910.5 mg/dL  POCT CBC      Result Value Ref Range   WBC 3.8 (*) 4.6 - 10.2 K/uL   Lymph, poc 1.7  0.6 - 3.4   POC LYMPH PERCENT 45.8  10 - 50 %L   MID (cbc) 0.3  0 - 0.9   POC MID % 9.0  0 - 12 %M   POC Granulocyte 1.7 (*) 2 - 6.9   Granulocyte percent 45.2  37 - 80 %G   RBC 4.44  4.04 - 5.48 M/uL   Hemoglobin 12.7  12.2 - 16.2 g/dL   HCT, POC 16.1  09.6 - 47.9 %   MCV 87.1  80 - 97 fL   MCH, POC 28.7  27 - 31.2 pg   MCHC 32.9  31.8 - 35.4 g/dL   RDW, POC 04.5     Platelet Count, POC 326  142 - 424 K/uL   MPV 6.9  0 - 99.8 fL  POCT INFLUENZA A/B      Result Value Ref Range   Influenza A, POC Negative     Influenza B, POC Negative

## 2014-10-05 NOTE — Patient Instructions (Signed)
Dehydration, Adult Dehydration is when you lose more fluids from the body than you take in. Vital organs like the kidneys, brain, and heart cannot function without a proper amount of fluids and salt. Any loss of fluids from the body can cause dehydration.  CAUSES   Vomiting.  Diarrhea.  Excessive sweating.  Excessive urine output.  Fever. SYMPTOMS  Mild dehydration  Thirst.  Dry lips.  Slightly dry mouth. Moderate dehydration  Very dry mouth.  Sunken eyes.  Skin does not bounce back quickly when lightly pinched and released.  Dark urine and decreased urine production.  Decreased tear production.  Headache. Severe dehydration  Very dry mouth.  Extreme thirst.  Rapid, weak pulse (more than 100 beats per minute at rest).  Cold hands and feet.  Not able to sweat in spite of heat and temperature.  Rapid breathing.  Blue lips.  Confusion and lethargy.  Difficulty being awakened.  Minimal urine production.  No tears. DIAGNOSIS  Your caregiver will diagnose dehydration based on your symptoms and your exam. Blood and urine tests will help confirm the diagnosis. The diagnostic evaluation should also identify the cause of dehydration. TREATMENT  Treatment of mild or moderate dehydration can often be done at home by increasing the amount of fluids that you drink. It is best to drink small amounts of fluid more often. Drinking too much at one time can make vomiting worse. Refer to the home care instructions below. Severe dehydration needs to be treated at the hospital where you will probably be given intravenous (IV) fluids that contain water and electrolytes. HOME CARE INSTRUCTIONS   Ask your caregiver about specific rehydration instructions.  Drink enough fluids to keep your urine clear or pale yellow.  Drink small amounts frequently if you have nausea and vomiting.  Eat as you normally do.  Avoid:  Foods or drinks high in sugar.  Carbonated  drinks.  Juice.  Extremely hot or cold fluids.  Drinks with caffeine.  Fatty, greasy foods.  Alcohol.  Tobacco.  Overeating.  Gelatin desserts.  Wash your hands well to avoid spreading bacteria and viruses.  Only take over-the-counter or prescription medicines for pain, discomfort, or fever as directed by your caregiver.  Ask your caregiver if you should continue all prescribed and over-the-counter medicines.  Keep all follow-up appointments with your caregiver. SEEK MEDICAL CARE IF:  You have abdominal pain and it increases or stays in one area (localizes).  You have a rash, stiff neck, or severe headache.  You are irritable, sleepy, or difficult to awaken.  You are weak, dizzy, or extremely thirsty. SEEK IMMEDIATE MEDICAL CARE IF:   You are unable to keep fluids down or you get worse despite treatment.  You have frequent episodes of vomiting or diarrhea.  You have blood or green matter (bile) in your vomit.  You have blood in your stool or your stool looks black and tarry.  You have not urinated in 6 to 8 hours, or you have only urinated a small amount of very dark urine.  You have a fever.  You faint. MAKE SURE YOU:   Understand these instructions.  Will watch your condition.  Will get help right away if you are not doing well or get worse. Document Released: 12/11/2005 Document Revised: 03/04/2012 Document Reviewed: 07/31/2011 ExitCare Patient Information 2015 [employer]. This information is not intended to replace advice given to you by your health care provider. Make sure you discuss any questions you have with your health care   provider.  Influenza Influenza ("the flu") is a viral infection of the respiratory tract. It occurs more often in winter months because people spend more time in close contact with one another. Influenza can make you feel very sick. Influenza easily spreads from person to person (contagious). CAUSES  Influenza is caused  by a virus that infects the respiratory tract. You can catch the virus by breathing in droplets from an infected person's cough or sneeze. You can also catch the virus by touching something that was recently contaminated with the virus and then touching your mouth, nose, or eyes. RISKS AND COMPLICATIONS You may be at risk for a more severe case of influenza if you smoke cigarettes, have diabetes, have chronic heart disease (such as heart failure) or lung disease (such as asthma), or if you have a weakened immune system. Elderly people and pregnant women are also at risk for more serious infections. The most common problem of influenza is a lung infection (pneumonia). Sometimes, this problem can require emergency medical care and may be life threatening. SIGNS AND SYMPTOMS  Symptoms typically last 4 to 10 days and may include:  Fever.  Chills.  Headache, body aches, and muscle aches.  Sore throat.  Chest discomfort and cough.  Poor appetite.  Weakness or feeling tired.  Dizziness.  Nausea or vomiting. DIAGNOSIS  Diagnosis of influenza is often made based on your history and a physical exam. A nose or throat swab test can be done to confirm the diagnosis. TREATMENT  In mild cases, influenza goes away on its own. Treatment is directed at relieving symptoms. For more severe cases, your health care provider may prescribe antiviral medicines to shorten the sickness. Antibiotic medicines are not effective because the infection is caused by a virus, not by bacteria. HOME CARE INSTRUCTIONS  Take medicines only as directed by your health care provider.  Use a cool mist humidifier to make breathing easier.  Get plenty of rest until your temperature returns to normal. This usually takes 3 to 4 days.  Drink enough fluid to keep your urine clear or pale yellow.  Cover yourmouth and nosewhen coughing or sneezing,and wash your handswellto prevent thevirusfrom spreading.  Stay  homefromwork orschool untilthe fever is gonefor at least 771full day. PREVENTION  An annual influenza vaccination (flu shot) is the best way to avoid getting influenza. An annual flu shot is now routinely recommended for all adults in the [place] SEEK MEDICAL CARE IF:  You experiencechest pain, yourcough worsens,or you producemore mucus.  Youhave nausea,vomiting, ordiarrhea.  Your fever returns or gets worse. SEEK IMMEDIATE MEDICAL CARE IF:  You havetrouble breathing, you become short of breath,or your skin ornails becomebluish.  You have severe painor stiffnessin the neck.  You develop a sudden headache, or pain in the face or ear.  You have nausea or vomiting that you cannot control. MAKE SURE YOU:   Understand these instructions.  Will watch your condition.  Will get help right away if you are not doing well or get worse. Document Released: 12/08/2000 Document Revised: 04/27/2014 Document Reviewed: 03/11/2012 [facility] Patient Information 2015 [employer] [place]. This information is not intended to replace advice given to you by your health care provider. Make sure you discuss any questions you have with your health care provider.

## 2014-10-06 LAB — COMPREHENSIVE METABOLIC PANEL WITH GFR
ALT: 28 U/L (ref 0–35)
AST: 25 U/L (ref 0–37)
Albumin: 3.6 g/dL (ref 3.5–5.2)
Alkaline Phosphatase: 65 U/L (ref 39–117)
BUN: 14 mg/dL (ref 6–23)
CO2: 27 meq/L (ref 19–32)
Calcium: 8.8 mg/dL (ref 8.4–10.5)
Chloride: 100 meq/L (ref 96–112)
Creat: 0.79 mg/dL (ref 0.50–1.10)
Glucose, Bld: 85 mg/dL (ref 70–99)
Potassium: 4.2 meq/L (ref 3.5–5.3)
Sodium: 136 meq/L (ref 135–145)
Total Bilirubin: 0.5 mg/dL (ref 0.2–1.2)
Total Protein: 7.4 g/dL (ref 6.0–8.3)

## 2014-10-07 ENCOUNTER — Telehealth: Payer: Self-pay

## 2014-10-07 LAB — HSV(HERPES SIMPLEX VRS) I + II AB-IGG
HSV 1 Glycoprotein G Ab, IgG: 5.66 IV — ABNORMAL HIGH
HSV 2 Glycoprotein G Ab, IgG: 3.28 IV — ABNORMAL HIGH

## 2014-10-07 NOTE — Telephone Encounter (Signed)
Called pt- She needs a note excusing her for work Friday, Saturday, Sunday.  Letter written and in pick up drawer for pt-- Pt advised.

## 2014-10-07 NOTE — Telephone Encounter (Signed)
Pt was seen by Dr. Clelia CroftShaw and would like her to give a call back, Didn't want to discuss what it is about. Please call [phone]

## 2014-10-08 LAB — HERPES SIMPLEX VIRUS CULTURE: Organism ID, Bacteria: NOT DETECTED

## 2014-10-15 ENCOUNTER — Encounter: Payer: Self-pay | Admitting: *Deleted

## 2015-12-20 ENCOUNTER — Telehealth: Payer: Self-pay | Admitting: Family Medicine

## 2015-12-20 NOTE — Telephone Encounter (Signed)
Called left message with sister to reschedule her appt with [name] on 12/30/15 sister states that she would get her to call back later

## 2015-12-30 ENCOUNTER — Encounter: Payer: Self-pay | Admitting: Family Medicine

## 2015-12-30 DIAGNOSIS — G4700 Insomnia, unspecified: Secondary | ICD-10-CM | POA: Insufficient documentation

## 2016-03-06 ENCOUNTER — Telehealth: Payer: Self-pay | Admitting: *Deleted

## 2016-03-06 ENCOUNTER — Ambulatory Visit (INDEPENDENT_AMBULATORY_CARE_PROVIDER_SITE_OTHER): Payer: Managed Care, Other (non HMO) | Admitting: Family Medicine

## 2016-03-06 ENCOUNTER — Ambulatory Visit (INDEPENDENT_AMBULATORY_CARE_PROVIDER_SITE_OTHER): Payer: Managed Care, Other (non HMO)

## 2016-03-06 VITALS — BP 120/70 | HR 71 | Temp 97.8°F | Resp 20 | Ht 69.5 in | Wt 231.0 lb

## 2016-03-06 DIAGNOSIS — A499 Bacterial infection, unspecified: Secondary | ICD-10-CM

## 2016-03-06 DIAGNOSIS — R0981 Nasal congestion: Secondary | ICD-10-CM | POA: Diagnosis not present

## 2016-03-06 DIAGNOSIS — E559 Vitamin D deficiency, unspecified: Secondary | ICD-10-CM | POA: Diagnosis not present

## 2016-03-06 DIAGNOSIS — R3129 Other microscopic hematuria: Secondary | ICD-10-CM | POA: Diagnosis not present

## 2016-03-06 DIAGNOSIS — B9689 Other specified bacterial agents as the cause of diseases classified elsewhere: Secondary | ICD-10-CM

## 2016-03-06 DIAGNOSIS — Z1329 Encounter for screening for other suspected endocrine disorder: Secondary | ICD-10-CM

## 2016-03-06 DIAGNOSIS — Z23 Encounter for immunization: Secondary | ICD-10-CM | POA: Diagnosis not present

## 2016-03-06 DIAGNOSIS — Z1389 Encounter for screening for other disorder: Secondary | ICD-10-CM

## 2016-03-06 DIAGNOSIS — K219 Gastro-esophageal reflux disease without esophagitis: Secondary | ICD-10-CM | POA: Diagnosis not present

## 2016-03-06 DIAGNOSIS — M79671 Pain in right foot: Secondary | ICD-10-CM

## 2016-03-06 DIAGNOSIS — Z130 Encounter for screening for diseases of the blood and blood-forming organs and certain disorders involving the immune mechanism: Secondary | ICD-10-CM

## 2016-03-06 DIAGNOSIS — Z1383 Encounter for screening for respiratory disorder NEC: Secondary | ICD-10-CM | POA: Diagnosis not present

## 2016-03-06 DIAGNOSIS — Z0000 Encounter for general adult medical examination without abnormal findings: Secondary | ICD-10-CM

## 2016-03-06 DIAGNOSIS — Z136 Encounter for screening for cardiovascular disorders: Secondary | ICD-10-CM

## 2016-03-06 DIAGNOSIS — N760 Acute vaginitis: Secondary | ICD-10-CM

## 2016-03-06 DIAGNOSIS — Z113 Encounter for screening for infections with a predominantly sexual mode of transmission: Secondary | ICD-10-CM

## 2016-03-06 DIAGNOSIS — J309 Allergic rhinitis, unspecified: Secondary | ICD-10-CM | POA: Diagnosis not present

## 2016-03-06 LAB — POC MICROSCOPIC URINALYSIS (UMFC): Mucus: ABSENT

## 2016-03-06 LAB — COMPREHENSIVE METABOLIC PANEL
ALT: 22 U/L (ref 6–29)
AST: 15 U/L (ref 10–35)
Albumin: 3.9 g/dL (ref 3.6–5.1)
Alkaline Phosphatase: 94 U/L (ref 33–130)
BUN: 16 mg/dL (ref 7–25)
CO2: 29 mmol/L (ref 20–31)
Calcium: 9.4 mg/dL (ref 8.6–10.4)
Chloride: 104 mmol/L (ref 98–110)
Creat: 0.76 mg/dL (ref 0.50–1.05)
Glucose, Bld: 74 mg/dL (ref 65–99)
Potassium: 3.9 mmol/L (ref 3.5–5.3)
Sodium: 139 mmol/L (ref 135–146)
Total Bilirubin: 0.6 mg/dL (ref 0.2–1.2)
Total Protein: 7.1 g/dL (ref 6.1–8.1)

## 2016-03-06 LAB — POCT URINALYSIS DIP (MANUAL ENTRY)
Bilirubin, UA: NEGATIVE
Glucose, UA: NEGATIVE
Ketones, POC UA: NEGATIVE
Leukocytes, UA: NEGATIVE
Nitrite, UA: NEGATIVE
Protein Ur, POC: NEGATIVE
Spec Grav, UA: 1.02
Urobilinogen, UA: 2
pH, UA: 5.5

## 2016-03-06 LAB — POCT WET + KOH PREP
Trich by wet prep: ABSENT
Yeast by KOH: ABSENT
Yeast by wet prep: ABSENT

## 2016-03-06 LAB — CBC
HCT: 40.6 % (ref 36.0–46.0)
Hemoglobin: 13.5 g/dL (ref 12.0–15.0)
MCH: 29.2 pg (ref 26.0–34.0)
MCHC: 33.3 g/dL (ref 30.0–36.0)
MCV: 87.9 fL (ref 78.0–100.0)
MPV: 9.5 fL (ref 8.6–12.4)
Platelets: 296 10*3/uL (ref 150–400)
RBC: 4.62 MIL/uL (ref 3.87–5.11)
RDW: 14.4 % (ref 11.5–15.5)
WBC: 5 10*3/uL (ref 4.0–10.5)

## 2016-03-06 LAB — LDL CHOLESTEROL, DIRECT: Direct LDL: 206 mg/dL — ABNORMAL HIGH (ref <130)

## 2016-03-06 LAB — TSH: TSH: 0.88 mIU/L

## 2016-03-06 MED ORDER — FLUTICASONE PROPIONATE 50 MCG/ACT NA SUSP: 2.0000 | Freq: Every day | NASAL | Status: DC

## 2016-03-06 MED ORDER — MELOXICAM 15 MG PO TABS: 15.0000 mg | ORAL_TABLET | Freq: Every day | ORAL | Status: DC

## 2016-03-06 MED ORDER — DIPHENHYDRAMINE HCL (SLEEP) 25 MG PO TABS: 25.0000 mg | ORAL_TABLET | Freq: Every day | ORAL | Status: DC

## 2016-03-06 MED ORDER — LORATADINE 10 MG PO TABS: 10.0000 mg | ORAL_TABLET | Freq: Every day | ORAL | Status: DC

## 2016-03-06 MED ORDER — METRONIDAZOLE 500 MG PO TABS: 500.0000 mg | ORAL_TABLET | Freq: Two times a day (BID) | ORAL | Status: DC

## 2016-03-06 MED ORDER — OMEPRAZOLE 40 MG PO CPDR: 40.0000 mg | DELAYED_RELEASE_CAPSULE | Freq: Every day | ORAL | Status: DC

## 2016-03-06 NOTE — Telephone Encounter (Signed)
Faxed signed authorization for medical record release for colonoscopy results (previous 2 yrs), per Dr Clelia CroftShaw. Confirmation page received at 5:23 p.

## 2016-03-06 NOTE — Progress Notes (Addendum)
Subjective:  By signing my name below, I, [name], attest that this documentation has been prepared under the direction and in the presence of Judith Sorenson, MD.  Electronically Signed: Andrew Au, ED Scribe. 03/06/2016. 3:44 PM.   Patient ID: [name], female    DOB: 03/19/1961, 55 y.o.   MRN: [id number]  HPI Chief Complaint  Patient presents with  . Annual Exam    right foot pain    HPI Comments: [name] is a 55 y.o. female who presents to the [facility] an annual exam. Pt states she has been doing. Pt is not fasting.   Health Maintenance Mammogram: 2016 which was normal. At her mammogram in 2015 a fibroglandular tissue was noted. She was told to come back in 6 months.  Pap smear: 2014  Colonoscopy: 2 years ago which was normal. She is to recheck in 10 years. Will have paper work faxed over.   Immunization Immunization History  Administered Date(s) Administered  . Influenza, Seasonal, Injecte, Preservative Fre 12/20/2012  . Influenza,inj,Quad PF,36+ Mos 10/05/2014   TDAP: pt does not have tetanus Flu shot: she declines flu shot  HIV and Hep C Pt agrees to testing.   Exercise Pt admits to not exercising as much as she should.   Dentist She is seen by dentist once a year.   Vision  Visual Acuity Screening   Right eye Left eye Both eyes  Without correction:     With correction: 20/30-1 20/40 20/25    Other  Pt report REFLUX when laying down or with talking. She has taken Tums in the past but would like a stronger medication. She does mention having a cough.    Pt also c/o right foot pain tio the dorsum of the foot. She denies know injury. She reports worsening pain with walking and bearing weight. She has hx of plantar fascitis but states this has gotten better since buying new shoes.    Pt also has trouble sleeping. She thinks this may be due to menopause. She reports hot flashes and occasionally waking up feeling tired. She has  tried melatonin without relief. She has not tried other medications,    Pt also reports vaginal and urinary odor  Patient Active Problem List   Diagnosis Date Noted  . Insomnia 12/30/2015  . Obesity 12/20/2012   Past Medical History  Diagnosis Date  . Allergy    No past surgical history on file. Allergies  Allergen Reactions  . Floxin [Ofloxacin] Rash   Prior to Admission medications   Medication Sig Start Date End Date Taking? Authorizing Provider  chlorpheniramine-HYDROcodone (TUSSIONEX PENNKINETIC ER) 10-8 MG/5ML LQCR Take 5 mLs by mouth every 12 (twelve) hours as needed. Patient not taking: Reported on 03/06/2016 10/05/14   [name], MD  Diphenhyd-Hydrocort-Nystatin (FIRST-DUKES MOUTHWASH) SUSP Use as directed 5 mLs in the mouth or throat every 2 (two) hours as needed (sore throat). Patient not taking: Reported on 03/06/2016 10/05/14   [name], MD  fluticasone [facility]) 50 MCG/ACT nasal spray Place 2 sprays into the nose daily. Patient not taking: Reported on 03/06/2016 12/20/12   [name], MD  Guaifenesin [facility] MAXIMUM STRENGTH) 1200 MG TB12 Take 1 tablet (1,200 mg total) by mouth every 12 (twelve) hours as needed. Patient not taking: Reported on 03/06/2016 10/05/14   [name], MD  meloxicam (MOBIC) 15 MG tablet Take 1 tablet (15 mg total) by mouth daily. Patient not taking: Reported on 03/06/2016  07/06/14   [name], FNP  promethazine (PHENERGAN) 25 MG tablet Take 1 tablet (25 mg total) by mouth every 8 (eight) hours as needed for nausea or vomiting. Patient not taking: Reported on 03/06/2016 10/05/14   Sherren MochaEva N Aniza Shor, MD   Social History   Social History  . Marital Status: Married    Spouse Name: N/A  . Number of Children: N/A  . Years of Education: N/A   Occupational History  . Not on file.   Social History Main Topics  . Smoking status: Never Smoker   . Smokeless tobacco: Not on file  . Alcohol Use: No  . Drug Use: No  . Sexual Activity: Yes      Birth Control/ Protection: Condom, Post-menopausal   Other Topics Concern  . Not on file   Social History Narrative   Family History  Problem Relation Age of Onset  . Heart disease Mother   . Stroke Sister     Review of Systems  Respiratory: Positive for cough.   Musculoskeletal: Positive for myalgias and arthralgias.  Skin: Negative for color change and rash.  Neurological: Negative for weakness and numbness.  Psychiatric/Behavioral: Positive for sleep disturbance.  All other systems reviewed and are negative.    Objective:   Physical Exam  Constitutional: She is oriented to person, place, and time. She appears well-developed and well-nourished. No distress.  HENT:  Head: Normocephalic and atraumatic.  Right Ear: Hearing, external ear and ear canal normal. Tympanic membrane is injected.  Left Ear: Hearing, tympanic membrane, external ear and ear canal normal.  Nose: Nose normal.  Eyes: Conjunctivae and EOM are normal.  Neck: Neck supple. No thyromegaly present.  Cardiovascular: Normal rate.   Pulmonary/Chest: Effort normal and breath sounds normal.  Abdominal: Soft. She exhibits no distension. There is no tenderness.  Musculoskeletal: Normal range of motion.  Pain over mid tarsals of the foot. MT non-TTP. Swelling over lateral tarsals with point TTP. No pain over 5th or 1st. TTP over AITF ligament.    Lymphadenopathy:    She has no cervical adenopathy.  Neurological: She is alert and oriented to person, place, and time.  Skin: Skin is warm and dry.  Psychiatric: She has a normal mood and affect. Her behavior is normal.  Nursing note and vitals reviewed.  Filed Vitals:   03/06/16 1510  BP: 120/70  Pulse: 71  Temp: 97.8 F (36.6 C)  TempSrc: Oral  Resp: 20  Height: 5' 9.5" (1.765 m)  Weight: 231 lb (104.781 kg)  SpO2: 99%     EKG- normal sinus rhythm. No ischemic change.  Results for orders placed or performed in visit on 03/06/16  POCT urinalysis  dipstick  Result Value Ref Range   Color, UA yellow yellow   Clarity, UA clear clear   Glucose, UA negative negative   Bilirubin, UA negative negative   Ketones, POC UA negative negative   Spec Grav, UA 1.020    Blood, UA moderate (A) negative   pH, UA 5.5    Protein Ur, POC negative negative   Urobilinogen, UA 2.0    Nitrite, UA Negative Negative   Leukocytes, UA Negative Negative  POCT Wet + KOH Prep  Result Value Ref Range   Yeast by KOH Absent Present, Absent   Yeast by wet prep Absent Present, Absent   WBC by wet prep Few None, Few, Too numerous to count   Clue Cells Wet Prep HPF POC Few (A) None, Too numerous to count  Trich by wet prep Absent Present, Absent   Bacteria Wet Prep HPF POC Moderate (A) None, Few, Too numerous to count   Epithelial Cells By [employer] ([facility]) Many (A) None, Few, Too numerous to count   RBC,UR,HPF,POC None None RBC/hpf  POCT Microscopic Urinalysis ([facility])  Result Value Ref Range   WBC,UR,HPF,POC None None WBC/hpf   RBC,UR,HPF,POC None None RBC/hpf   Bacteria None None, Too numerous to count   Mucus Absent Absent   Epithelial Cells, UR Per Microscopy Few (A) None, Too numerous to count cells/hpf   Dg Foot Complete Right  03/06/2016  CLINICAL DATA:  The dorsal right foot pain. No injury. Pain worse with bearing weight. History of plantar fasciitis. EXAM: RIGHT FOOT COMPLETE - 3+ VIEW COMPARISON:  12/20/2012 FINDINGS: Exam demonstrates minimal degenerate change over the midfoot. There is a moderate size inferior calcaneal spur which is worse. No acute fracture or dislocation. IMPRESSION: No acute findings. Interval progression of moderate size inferior calcaneal spur which can be associated with plantar fasciitis. Electronically Signed   By: Elberta Fortis M.D.   On: 03/06/2016 17:10     Assessment & Plan:   1. Annual physical exam   2. Routine screening for STI (sexually transmitted infection)   3. Screening for cardiovascular, respiratory, and  genitourinary diseases   4. Screening for deficiency anemia   5. Screening for thyroid disorder   6. Right foot pain   7. Gastroesophageal reflux disease, esophagitis presence not specified   8. Allergic rhinitis, unspecified allergic rhinitis type   9. Nasal congestion   10. Right hip pain     Orders Placed This Encounter  Procedures  . DG Foot Complete Right    Standing Status: Future     Number of Occurrences: 1     Standing Expiration Date: 03/06/2017    Order Specific Question:  Reason for Exam (SYMPTOM  OR DIAGNOSIS REQUIRED)    Answer:  pain and swelling over lateral tarsal bones    Order Specific Question:  Is the patient pregnant?    Answer:  No    Order Specific Question:  Preferred imaging location?    Answer:  External  . Tdap vaccine greater than or equal to 7yo IM  . CBC  . Comprehensive metabolic panel  . TSH  . VITAMIN D 25 Hydroxy (Vit-D Deficiency, Fractures)  . HIV antibody  . LDL Cholesterol, Direct  . Hepatitis C Antibody  . POCT urinalysis dipstick  . POCT Wet + KOH Prep  . EKG 12-Lead   I personally performed the services described in this documentation, which was scribed in my presence. The recorded information has been reviewed and considered, and addended by me as needed.  Judith Sorenson, MD MPH

## 2016-03-06 NOTE — Patient Instructions (Addendum)
IF you received an x-ray today, you will receive an invoice from [facility]. Please contact [facility] at [phone] with questions or concerns regarding your invoice.   IF you received labwork today, you will receive an invoice from [employer]. Please contact [facility] at [phone] with questions or concerns regarding your invoice.   Our billing staff will not be able to assist you with questions regarding bills from these companies.  You will be contacted with the lab results as soon as they are available. The fastest way to get your results is to activate your My Chart account. Instructions are located on the last page of this paperwork. If you have not heard from [place] regarding the results in 2 weeks, please contact this office.  Do not use wany other otc pain medication other than tylenol/acetaminophen - so no aleve, ibuprofen, motrin, advil, etc.  Keeping You Healthy  Get These Tests  Blood Pressure- Have your blood pressure checked by your healthcare provider at least once a year.  Normal blood pressure is 120/80.  Weight- Have your body mass index (BMI) calculated to screen for obesity.  BMI is a measure of body fat based on height and weight.  You can calculate your own BMI at https://www.west-esparza.com/  Cholesterol- Have your cholesterol checked every year.  Diabetes- Have your blood sugar checked every year if you have high blood pressure, high cholesterol, a family history of diabetes or if you are overweight.  Pap Test - Have a pap test every 1 to 5 years if you have been sexually active.  If you are older than 65 and recent pap tests have been normal you may not need additional pap tests.  In addition, if you have had a hysterectomy  for benign disease additional pap tests are not necessary.  Mammogram-Yearly mammograms are essential for early detection of breast cancer  Screening for Colon Cancer- Colonoscopy starting at age 88.  Screening may begin sooner depending on your family history and other health conditions.  Follow up colonoscopy as directed by your Gastroenterologist.  Screening for Osteoporosis- Screening begins at age 85 with bone density scanning, sooner if you are at higher risk for developing Osteoporosis.  Get these medicines  Calcium with Vitamin D- Your body requires 1200-1500 mg of Calcium a day and [phone] IU of Vitamin D a day.  You can only absorb 500 mg of Calcium at a time therefore Calcium must be taken in 2 or 3 separate doses throughout the day.  Hormones- Hormone therapy has been associated with increased risk for certain cancers and heart disease.  Talk to your healthcare provider about if you need relief from menopausal symptoms.  Aspirin- Ask your healthcare provider about taking Aspirin to prevent Heart Disease and Stroke.  Get these Immuniztions  Flu shot- Every fall  Pneumonia shot- Once after the age of 3; if you are younger ask your healthcare provider if you need a pneumonia shot.  Tetanus- Every ten years.  Zostavax- Once after the age of 41 to prevent shingles.  Take these steps  Don't smoke- Your healthcare provider can help you quit. For tips on how to quit, ask your healthcare provider or go to www.smokefree.gov or call 1-800 QUIT-NOW.  Be physically active- Exercise 5 days a week for a minimum of 30 minutes.  If you are not already physically active, start slow and gradually work up to 30 minutes of moderate physical activity.  Try walking, dancing, bike riding, swimming, etc.  Eat a healthy diet- Eat a variety of healthy foods such as fruits, vegetables, whole grains, low fat milk, low fat cheeses, yogurt, lean meats, chicken, fish, eggs, dried beans, tofu, etc.  For more information go to www.thenutritionsource.org  Dental visit- Brush and floss teeth twice daily; visit your dentist twice a year.  Eye exam- Visit your Optometrist or Ophthalmologist  yearly.  Drink alcohol in moderation- Limit alcohol intake to one drink or less a day.  Never drink and drive.  Depression- Your emotional health is as important as your physical health.  If you're feeling down or losing interest in things you normally enjoy, please talk to your healthcare provider.  Seat Belts- can save your life; always wear one  Smoke/Carbon Monoxide detectors- These detectors need to be installed on the appropriate level of your home.  Replace batteries at least once a year.  Violence- If anyone is threatening or hurting you, please tell your healthcare provider.  Living Will/ Health care power of attorney- Discuss with your healthcare provider and family.  Foot Sprain A foot sprain is an injury to one of the strong bands of tissue (ligaments) that connect and support the many bones in your feet. The ligament can be stretched too much or it can tear. A tear can be either partial or complete. The severity of the sprain depends on how much of the ligament was damaged or torn. CAUSES A foot sprain is usually caused by suddenly twisting or pivoting your foot. RISK FACTORS This injury is more likely to occur in people who: 9. Play a sport, such as basketball or football. 10. Exercise or play a sport without warming up. 11. Start a new workout or sport. 12. Suddenly increase how long or hard they exercise or play a sport. SYMPTOMS Symptoms of this condition start soon after an injury and include: 4. Pain, especially in the arch of the foot. 5. Bruising. 6. Swelling. 7. Inability to walk or use the foot to support body weight. DIAGNOSIS This condition is diagnosed with a medical history and physical exam. You may also have imaging tests, such as: 5. X-rays to make sure there are no broken bones (fractures). 6. MRI to see if the ligament has torn. TREATMENT Treatment varies depending on the severity of your sprain. Mild sprains can be treated with rest, ice,  compression, and elevation (RICE). If your ligament is overstretched or partially torn, treatment usually involves keeping your foot in a fixed position (immobilization) for a period of time. To help you do this, your health care provider will apply a bandage, splint, or walking boot to keep your foot from moving until it heals. You may also be advised to use crutches or a scooter for a few weeks to avoid bearing weight on your foot while it is healing. If your ligament is fully torn, you may need surgery to reconnect the ligament to the bone. After surgery, a cast or splint will be applied and will need to stay on your foot while it heals. Your health care provider may also suggest exercises or physical therapy to strengthen your foot. HOME CARE INSTRUCTIONS If You Have a Bandage, Splint, or Walking Boot: 12. Wear it as directed by your health care provider. Remove it only as directed by your health care provider. 13. Loosen the bandage, splint, or walking boot if your toes become numb and tingle, or if they turn cold and blue. Bathing  If your health care provider approves bathing and showering, cover  the bandage or splint with a watertight plastic bag to protect it from water. Do not let the bandage or splint get wet. Managing Pain, Stiffness, and Swelling   If directed, apply ice to the injured area:  Put ice in a plastic bag.  Place a towel between your skin and the bag.  Leave the ice on for 20 minutes, 2-3 times per day.  Move your toes often to avoid stiffness and to lessen swelling.  Raise (elevate) the injured area above the level of your heart while you are sitting or lying down. Driving  Do not drive or operate heavy machinery while taking pain medicine.  Do not drive while wearing a bandage, splint, or walking boot on a foot that you use for driving. Activity  Rest as directed by your health care provider.  Do not use the injured foot to support your body weight until  your health care provider says that you can. Use crutches or other supportive devices as directed by your health care provider.  Ask your health care provider what activities are safe for you. Gradually increase how much and how far you walk until your health care provider says it is safe to return to full activity.  Do any exercise or physical therapy as directed by your health care provider. General Instructions  If a splint was applied, do not put pressure on any part of it until it is fully hardened. This may take several hours.  Take medicines only as directed by your health care provider. These include over-the-counter medicines and prescription medicines.  Keep all follow-up visits as directed by your health care provider. This is important.  When you can walk without pain, wear supportive shoes that have stiff soles. Do not wear flip-flops, and do not walk barefoot. SEEK MEDICAL CARE IF:  Your pain is not controlled with medicine.  Your bruising or swelling gets worse or does not get better with treatment.  Your splint or walking boot is damaged. SEEK IMMEDIATE MEDICAL CARE IF:  Your foot is numb or blue.  Your foot feels colder than normal.   This information is not intended to replace advice given to you by your health care provider. Make sure you discuss any questions you have with your health care provider.   Document Released: 06/02/2002 Document Revised: 04/27/2015 Document Reviewed: 10/14/2014 Elsevier Interactive Patient Education [employer]

## 2016-03-06 NOTE — Telephone Encounter (Signed)
Receipt showing corrected distribution of payment sent by mail... CPE code [id number] was erroneously calculated in 10% co-insurance for DOS 03/06/16... The difference (84.80 with CPE-58.30 w/o CPE= $26.50) will be distributed to prior patient balance; leaving $73.50 due with an additional $139.41 in Bad debt. -KJR

## 2016-03-07 LAB — HIV ANTIBODY (ROUTINE TESTING W REFLEX): HIV 1&2 Ab, 4th Generation: NONREACTIVE

## 2016-03-07 LAB — HEPATITIS C ANTIBODY: HCV Ab: NEGATIVE

## 2016-03-07 LAB — VITAMIN D 25 HYDROXY (VIT D DEFICIENCY, FRACTURES): Vit D, 25-Hydroxy: 9 ng/mL — ABNORMAL LOW (ref 30–100)

## 2016-03-07 MED ORDER — ERGOCALCIFEROL 1.25 MG (50000 UT) PO CAPS: 50000.0000 [IU] | ORAL_CAPSULE | ORAL | Status: DC

## 2016-03-07 NOTE — Telephone Encounter (Signed)
PT made an additional payment/// and req. Receipt and copy of billing be sent out to verified address on record. - KJR

## 2016-03-08 LAB — URINE CULTURE
Colony Count: NO GROWTH
Organism ID, Bacteria: NO GROWTH

## 2016-03-15 ENCOUNTER — Telehealth: Payer: Self-pay

## 2016-03-15 NOTE — Telephone Encounter (Signed)
Patient would like someone to give her a call and go over her lab results from 03/06/16. Her call back number is [phone]

## 2016-03-17 NOTE — Telephone Encounter (Signed)
Tried to call pt. But was unable to reach her left vm to call back

## 2016-03-18 NOTE — Telephone Encounter (Signed)
lmom to cb. 

## 2016-03-22 ENCOUNTER — Encounter: Payer: Self-pay | Admitting: Family Medicine

## 2016-03-25 ENCOUNTER — Encounter: Payer: Self-pay | Admitting: *Deleted

## 2016-03-25 NOTE — Telephone Encounter (Signed)
Unable to reach letter sent

## 2016-06-20 ENCOUNTER — Other Ambulatory Visit: Payer: Self-pay

## 2016-06-20 MED ORDER — OMEPRAZOLE 40 MG PO CPDR: 40.0000 mg | DELAYED_RELEASE_CAPSULE | Freq: Every day | ORAL | Status: DC

## 2016-08-23 ENCOUNTER — Emergency Department (HOSPITAL_COMMUNITY)
Admission: EM | Admit: 2016-08-23 | Discharge: 2016-08-23 | Disposition: A | Discharge status: Home / Self Care | Payer: Self-pay | Attending: Emergency Medicine | Admitting: Emergency Medicine

## 2016-08-23 ENCOUNTER — Encounter (HOSPITAL_COMMUNITY): Payer: Self-pay | Admitting: Emergency Medicine

## 2016-08-23 ENCOUNTER — Emergency Department (HOSPITAL_COMMUNITY): Payer: Self-pay

## 2016-08-23 DIAGNOSIS — R102 Pelvic and perineal pain: Secondary | ICD-10-CM

## 2016-08-23 DIAGNOSIS — Z791 Long term (current) use of non-steroidal anti-inflammatories (NSAID): Secondary | ICD-10-CM | POA: Insufficient documentation

## 2016-08-23 DIAGNOSIS — N83201 Unspecified ovarian cyst, right side: Secondary | ICD-10-CM | POA: Insufficient documentation

## 2016-08-23 LAB — CBC
HCT: 38.9 % (ref 36.0–46.0)
Hemoglobin: 12.8 g/dL (ref 12.0–15.0)
MCH: 28.6 pg (ref 26.0–34.0)
MCHC: 32.9 g/dL (ref 30.0–36.0)
MCV: 86.8 fL (ref 78.0–100.0)
Platelets: 319 10*3/uL (ref 150–400)
RBC: 4.48 MIL/uL (ref 3.87–5.11)
RDW: 13.4 % (ref 11.5–15.5)
WBC: 4.7 10*3/uL (ref 4.0–10.5)

## 2016-08-23 LAB — URINALYSIS, ROUTINE W REFLEX MICROSCOPIC
Bilirubin Urine: NEGATIVE
Glucose, UA: NEGATIVE mg/dL
Ketones, ur: NEGATIVE mg/dL
Nitrite: NEGATIVE
Protein, ur: NEGATIVE mg/dL
Specific Gravity, Urine: 1.02 (ref 1.005–1.030)
pH: 5.5 (ref 5.0–8.0)

## 2016-08-23 LAB — COMPREHENSIVE METABOLIC PANEL
ALT: 14 U/L (ref 14–54)
AST: 17 U/L (ref 15–41)
Albumin: 4.2 g/dL (ref 3.5–5.0)
Alkaline Phosphatase: 89 U/L (ref 38–126)
Anion gap: 4 — ABNORMAL LOW (ref 5–15)
BUN: 15 mg/dL (ref 6–20)
CO2: 28 mmol/L (ref 22–32)
Calcium: 9.2 mg/dL (ref 8.9–10.3)
Chloride: 108 mmol/L (ref 101–111)
Creatinine, Ser: 0.81 mg/dL (ref 0.44–1.00)
GFR calc Af Amer: >60 mL/min (ref >60)
GFR calc non Af Amer: >60 mL/min (ref >60)
Glucose, Bld: 87 mg/dL (ref 65–99)
Potassium: 3.6 mmol/L (ref 3.5–5.1)
Sodium: 140 mmol/L (ref 135–145)
Total Bilirubin: 0.7 mg/dL (ref 0.3–1.2)
Total Protein: 8.1 g/dL (ref 6.5–8.1)

## 2016-08-23 LAB — URINE MICROSCOPIC-ADD ON

## 2016-08-23 LAB — LIPASE, BLOOD: Lipase: 30 U/L (ref 11–51)

## 2016-08-23 LAB — WET PREP, GENITAL
Clue Cells Wet Prep HPF POC: NONE SEEN
Sperm: NONE SEEN
Trich, Wet Prep: NONE SEEN
Yeast Wet Prep HPF POC: NONE SEEN

## 2016-08-23 MED ORDER — HYDROCODONE-ACETAMINOPHEN 5-325 MG PO TABS: 2.0000 | ORAL_TABLET | ORAL | 0 refills | Status: DC | PRN

## 2016-08-23 MED ORDER — ONDANSETRON HCL 4 MG/2ML IJ SOLN
4.0000 mg | Freq: Once | INTRAMUSCULAR | Status: AC
  Administered 2016-08-23: 4 mg via INTRAVENOUS
  Filled 2016-08-23: qty 2

## 2016-08-23 MED ORDER — HYDROMORPHONE HCL 1 MG/ML IJ SOLN
0.5000 mg | Freq: Once | INTRAMUSCULAR | Status: AC
  Administered 2016-08-23: 0.5 mg via INTRAVENOUS
  Filled 2016-08-23: qty 1

## 2016-08-23 MED ORDER — KETOROLAC TROMETHAMINE 30 MG/ML IJ SOLN
30.0000 mg | Freq: Once | INTRAMUSCULAR | Status: AC
  Administered 2016-08-23: 30 mg via INTRAVENOUS
  Filled 2016-08-23: qty 1

## 2016-08-23 NOTE — ED Notes (Signed)
Patient transported to CT 

## 2016-08-23 NOTE — ED Provider Notes (Signed)
[facility]-EMERGENCY DEPT Provider Note   CSN: [id number] Arrival date & time: 08/23/16  1450     History   Chief Complaint Chief Complaint  Patient presents with  . Abdominal Pain    HPI [name] is a 55 y.o. female who presents with abdominal pain. PMH of uterine fibroids. She states that over the past 3 days she has had worsening RLQ pain which radiates through to her R flank. She reports some urinary frequency as well. Denies fever, chills, chest pain, SOB, N/V/D, dysuria, vaginal discharge or bleeding. No hx of kidney disease or kidney stones. She went to UC who referred her to the ED to rule out appendicitis vs a kidney stone. They did a UA which showed a small amount of blood in the urine.   HPI  Past Medical History:  Diagnosis Date  . Allergy     Patient Active Problem List   Diagnosis Date Noted  . Insomnia 12/30/2015  . Obesity 12/20/2012    History reviewed. No pertinent surgical history.  OB History    Gravida Para Term Preterm AB Living   0 0 0 0 0 0   SAB TAB Ectopic Multiple Live Births   0 0 0 0         Home Medications    Prior to Admission medications   Medication Sig Start Date End Date Taking? Authorizing Provider  ibuprofen (ADVIL,MOTRIN) 200 MG tablet Take 800 mg by mouth every 6 (six) hours as needed for moderate pain.   Yes Historical Provider, MD  diphenhydrAMINE (SOMINEX) 25 MG tablet Take 1-2 tablets (25-50 mg total) by mouth at bedtime. Patient not taking: Reported on 08/23/2016 03/06/16   [name], MD  ergocalciferol (VITAMIN D2) 50000 units capsule Take 1 capsule (50,000 Units total) by mouth once a week. Patient not taking: Reported on 08/23/2016 03/07/16   [name], MD  fluticasone [facility]) 50 MCG/ACT nasal spray Place 2 sprays into both nostrils daily. Patient not taking: Reported on 08/23/2016 03/06/16   [name], MD  loratadine (CLARITIN) 10 MG tablet Take 1 tablet (10 mg total) by mouth at bedtime. Patient not taking:  Reported on 08/23/2016 03/06/16   [name], MD  meloxicam (MOBIC) 15 MG tablet Take 1 tablet (15 mg total) by mouth daily. Patient not taking: Reported on 08/23/2016 03/06/16   [name], MD  metroNIDAZOLE (FLAGYL) 500 MG tablet Take 1 tablet (500 mg total) by mouth 2 (two) times daily. Patient not taking: Reported on 08/23/2016 03/06/16   [name], MD  omeprazole (PRILOSEC) 40 MG capsule Take 1 capsule (40 mg total) by mouth daily. 30 minutes before dinner Patient not taking: Reported on 08/23/2016 06/20/16   [name], MD    Family History Family History  Problem Relation Age of Onset  . Heart disease Mother   . Stroke Sister     Social History Social History  Substance Use Topics  . Smoking status: Never Smoker  . Smokeless tobacco: Never Used  . Alcohol use No     Allergies   Floxin [ofloxacin]   Review of Systems Review of Systems  Constitutional: Negative for appetite change, chills and fever.  Respiratory: Negative for shortness of breath.   Cardiovascular: Negative for chest pain.  Gastrointestinal: Positive for abdominal pain. Negative for constipation, diarrhea, nausea and vomiting.  Genitourinary: Positive for flank pain and frequency. Negative for dysuria, vaginal bleeding and vaginal discharge.  All other systems reviewed and  are negative.    Physical Exam Updated Vital Signs BP 138/71 (BP Location: Left Arm)   Pulse 60   Temp 98.2 F (36.8 C) (Oral)   Resp 18   SpO2 100%   Physical Exam  Constitutional: She is oriented to person, place, and time. She appears well-developed and well-nourished. No distress.  Pleasant well appearing female in NAD  HENT:  Head: Normocephalic and atraumatic.  Eyes: Conjunctivae are normal. Pupils are equal, round, and reactive to light. Right eye exhibits no discharge. Left eye exhibits no discharge. No scleral icterus.  Neck: Normal range of motion. Neck supple.  Cardiovascular: Normal rate and regular rhythm.  Exam  reveals no gallop and no friction rub.   No murmur heard. Pulmonary/Chest: Effort normal and breath sounds normal. No respiratory distress. She has no wheezes. She has no rales. She exhibits no tenderness.  Abdominal: Soft. Bowel sounds are normal. She exhibits no distension and no mass. There is tenderness. There is no rebound and no guarding. No hernia.  Tenderness in right inguinal area. No RLQ tenderness. Some mild right flank tenderness  Genitourinary:  Genitourinary Comments: No inguinal lymphadenopathy or inguinal hernia noted. Normal external genitalia. No pain with speculum insertion. Closed cervical os with normal appearance - no rash or lesions. Small amount of white clumpy discharge. No bleeding noted from cervix or in vaginal vault. On bimanual examination no adnexal tenderness or cervical motion tenderness. Chaperone present during exam.    Musculoskeletal: She exhibits no edema.  Neurological: She is alert and oriented to person, place, and time.  Skin: Skin is warm and dry.  Psychiatric: She has a normal mood and affect. Her behavior is normal.  Nursing note and vitals reviewed.    ED Treatments / Results  Labs (all labs ordered are listed, but only abnormal results are displayed) Labs Reviewed  WET PREP, GENITAL - Abnormal; Notable for the following:       Result Value   WBC, Wet Prep HPF POC MANY (*)    All other components within normal limits  COMPREHENSIVE METABOLIC PANEL - Abnormal; Notable for the following:    Anion gap 4 (*)    All other components within normal limits  URINALYSIS, ROUTINE W REFLEX MICROSCOPIC (NOT AT [facility]) - Abnormal; Notable for the following:    APPearance CLOUDY (*)    Hgb urine dipstick SMALL (*)    Leukocytes, UA SMALL (*)    All other components within normal limits  URINE MICROSCOPIC-ADD ON - Abnormal; Notable for the following:    Squamous Epithelial / LPF 0-5 (*)    Bacteria, UA RARE (*)    All other components within normal  limits  LIPASE, BLOOD  CBC  GC/CHLAMYDIA PROBE AMP ([place]) NOT AT [facility]    EKG  EKG Interpretation None       Radiology Ct Renal Stone Study  Result Date: 08/23/2016 CLINICAL DATA:  Right lower quadrant pain for 1 day. Microhematuria. EXAM: CT ABDOMEN AND PELVIS WITHOUT CONTRAST TECHNIQUE: Multidetector CT imaging of the abdomen and pelvis was performed following the standard protocol without IV contrast. COMPARISON:  None. FINDINGS: Lower chest: No significant pulmonary nodules or acute consolidative airspace disease. Hepatobiliary: Normal liver with no liver mass. Normal gallbladder with no radiopaque cholelithiasis. No biliary ductal dilatation. Pancreas: Normal, with no mass or duct dilation. Spleen: Normal size. No mass. Adrenals/Urinary Tract: Normal adrenals. No renal stones. No hydronephrosis. No contour deforming renal mass. Normal caliber ureters, with no ureteral stones. Under  distended bladder. No bladder stones. No bladder wall thickening. Stomach/Bowel: Grossly normal stomach. Normal caliber small bowel with no small bowel wall thickening. Normal appendix . Normal large bowel with no diverticulosis, large bowel wall thickening or pericolonic fat stranding. Vascular/Lymphatic: Normal caliber abdominal aorta. No pathologically enlarged lymph nodes in the abdomen or pelvis. Reproductive: There is a solid partially calcified 5.2 x 4.0 cm mass in the midline pelvis, which appears to arise from the anterior upper uterus, favor an exophytic fibroid. Posterior right adnexal 1.4 cm simple appearing cyst. No left adnexal mass. Other: No pneumoperitoneum, ascites or focal fluid collection. Musculoskeletal: No aggressive appearing focal osseous lesions. Mild thoracic spondylosis. IMPRESSION: 1. No urolithiasis.  No hydronephrosis. 2. Probable 5.2 cm exophytic fibroid in the anterior uterus. Right adnexal 1.4 cm cyst. Recommend correlation with transabdominal and transvaginal pelvic sonography,  probably on a short term outpatient basis unless otherwise clinically warranted. 3. Otherwise unremarkable CT, with no evidence of bowel obstruction or acute bowel inflammation. Normal appendix. Electronically Signed   By: Delbert Phenix M.D.   On: 08/23/2016 19:29    Procedures Procedures (including critical care time)  Medications Ordered in ED Medications  ketorolac (TORADOL) 30 MG/ML injection 30 mg (30 mg Intravenous Given 08/23/16 1841)  ondansetron (ZOFRAN) injection 4 mg (4 mg Intravenous Given 08/23/16 1841)  HYDROmorphone (DILAUDID) injection 0.5 mg (0.5 mg Intravenous Given 08/23/16 2110)     Initial Impression / Assessment and Plan / ED Course  I have reviewed the triage vital signs and the nursing notes.  Pertinent labs & imaging results that were available during my care of the patient were reviewed by me and considered in my medical decision making (see chart for details).  Clinical Course   55 year old female presents with abdominal pain and urinary frequency. Unclear etiology. Patient is afebrile, not tachycardic or tachypneic, normotensive, and not hypoxic. CBC and CMP are unremarkable. Lipase normal. UA again shows small hgb as well as small leukocytes and rare bacteria. Wet prep shows many WBCs. G&C sent. CT renal remarkable for uterine fibroid and right ovarian cyst but is otherwise unremarkable. Pain medicine given and patient advised to follow up with OBGYN for [place] follow up as outpatient. Patient is NAD, non-toxic, with stable VS. Patient is informed of clinical course, understands medical decision making process, and agrees with plan. Opportunity for questions provided and all questions answered. Return precautions given.   Final Clinical Impressions(s) / ED Diagnoses   Final diagnoses:  Cyst of right ovary  Pelvic pain in female    New Prescriptions Discharge Medication List as of 08/23/2016  9:41 PM    START taking these medications   Details    HYDROcodone-acetaminophen (NORCO/VICODIN) 5-325 MG tablet Take 2 tablets by mouth every 4 (four) hours as needed., Starting Wed 08/23/2016, Print         Bethel Born, PA-C 08/25/16 1401    Benjiman Core, MD 08/30/16 [phone]

## 2016-08-23 NOTE — ED Triage Notes (Signed)
Patient presents for RLQ abdominal pain, urinary frequency, right flank pain. Denies fever, N/V/D, last BM yesterday was normal for patient. Sent by UC to r/t appendicitis or kidney stone. A&O x4.

## 2016-08-24 LAB — GC/CHLAMYDIA PROBE AMP (~~LOC~~) NOT AT ARMC
Chlamydia: NEGATIVE
Neisseria Gonorrhea: NEGATIVE

## 2018-01-09 ENCOUNTER — Encounter: Payer: Self-pay | Admitting: Family Medicine

## 2018-01-09 ENCOUNTER — Ambulatory Visit (INDEPENDENT_AMBULATORY_CARE_PROVIDER_SITE_OTHER): Payer: BLUE CROSS/BLUE SHIELD | Admitting: Family Medicine

## 2018-01-09 VITALS — BP 132/82 | HR 87 | Temp 98.2°F | Resp 16 | Ht 69.5 in | Wt 237.6 lb

## 2018-01-09 DIAGNOSIS — Z136 Encounter for screening for cardiovascular disorders: Secondary | ICD-10-CM

## 2018-01-09 DIAGNOSIS — Z0000 Encounter for general adult medical examination without abnormal findings: Secondary | ICD-10-CM

## 2018-01-09 DIAGNOSIS — Z1389 Encounter for screening for other disorder: Secondary | ICD-10-CM

## 2018-01-09 DIAGNOSIS — Z23 Encounter for immunization: Secondary | ICD-10-CM | POA: Diagnosis not present

## 2018-01-09 DIAGNOSIS — Z113 Encounter for screening for infections with a predominantly sexual mode of transmission: Secondary | ICD-10-CM

## 2018-01-09 DIAGNOSIS — Z6834 Body mass index (BMI) 34.0-34.9, adult: Secondary | ICD-10-CM | POA: Diagnosis not present

## 2018-01-09 DIAGNOSIS — Z1329 Encounter for screening for other suspected endocrine disorder: Secondary | ICD-10-CM | POA: Diagnosis not present

## 2018-01-09 DIAGNOSIS — Z130 Encounter for screening for diseases of the blood and blood-forming organs and certain disorders involving the immune mechanism: Secondary | ICD-10-CM

## 2018-01-09 DIAGNOSIS — R0981 Nasal congestion: Secondary | ICD-10-CM | POA: Diagnosis not present

## 2018-01-09 DIAGNOSIS — Z1383 Encounter for screening for respiratory disorder NEC: Secondary | ICD-10-CM

## 2018-01-09 DIAGNOSIS — E6609 Other obesity due to excess calories: Secondary | ICD-10-CM

## 2018-01-09 LAB — POCT URINALYSIS DIP (MANUAL ENTRY)
Bilirubin, UA: NEGATIVE
Glucose, UA: NEGATIVE mg/dL
Ketones, POC UA: NEGATIVE mg/dL
Leukocytes, UA: NEGATIVE
Nitrite, UA: NEGATIVE
Protein Ur, POC: NEGATIVE mg/dL
Spec Grav, UA: 1.015 (ref 1.010–1.025)
Urobilinogen, UA: 2 E.U./dL — AB
pH, UA: 7 (ref 5.0–8.0)

## 2018-01-09 MED ORDER — METHYLPREDNISOLONE ACETATE 80 MG/ML IJ SUSP
80.0000 mg | Freq: Once | INTRAMUSCULAR | Status: AC
  Administered 2018-01-09: 80 mg via INTRAMUSCULAR

## 2018-01-09 NOTE — Progress Notes (Signed)
Subjective:  By signing my name below, I, [name], attest that this documentation has been prepared under the direction and in the presence of Judith SorensonEva Nyjae Hodge, MD Electronically Signed: Charline BillsEssence Wilson, ED Scribe 01/09/2018 at 3:14 PM.   Patient ID: [name], female    DOB: November 09, 1961, 57 y.o.   MRN: [id number]  Chief Complaint  Patient presents with  . Annual Exam    No Pap   HPI Judith EvangelistDenise Tendler is a 57 y.o. female who [facility] annual [facility]. Pt is fasting at this visit, last meal was 12 AM.   Primary Preventative Screenings: Cervical Cancer: last pap on 02/22/17 with Dr. Stefano GaulStringer Family Planning: STI screening: Breast Cancer: screening on 02/22/17 with Dr. Stefano GaulStringer  Colorectal Cancer: 3 yrs ago Tobacco use/EtOH/substances: Bone Density: Cardiac: Weight/Blood sugar/Diet/Exercise: Her appetite is up and down, she does not eat breakfast. Pt has not been exercising. BMI Readings from Last 3 Encounters:  01/09/18 34.58 kg/m  03/06/16 33.62 kg/m  10/05/14 31.03 kg/m   No results found for: HGBA1C OTC/Vit/Supp/Herbal: She is not currently taking any supplements but does report eating a lot of cheese.  Dentist/Optho: seen last yr by both Immunizations:  Immunization History  Administered Date(s) Administered  . Influenza, Seasonal, Injecte, Preservative Fre 12/20/2012  . Influenza,inj,Quad PF,6+ Mos 10/05/2014  . Tdap 03/06/2016  Influenza: today  Cold-like Symptoms Pt reports fatigue, nasal congestion, sinus pressure/pain, cough, intermittent ear popping for over 1 wk. She has tried Mucinex without relief. Occasionally takes Flonase but has not taken it with these symptoms. Denies fever, chills, dental pain.   Chronic Medical Conditions:   Past Medical History:  Diagnosis Date  . Allergy    No current outpatient medications on file prior to visit.   No current facility-administered medications on file prior to visit.    Allergies    Allergen Reactions  . Floxin [Ofloxacin] Rash   No past surgical history on file. Family History  Problem Relation Age of Onset  . Heart disease Mother   . Stroke Sister    Social History   Socioeconomic History  . Marital status: Married    Spouse name: None  . Number of children: None  . Years of education: None  . Highest education level: None  Social Needs  . Financial resource strain: None  . Food insecurity - worry: None  . Food insecurity - inability: None  . Transportation needs - medical: None  . Transportation needs - non-medical: None  Occupational History  . None  Tobacco Use  . Smoking status: Never Smoker  . Smokeless tobacco: Never Used  Substance and Sexual Activity  . Alcohol use: No  . Drug use: No  . Sexual activity: Yes    Birth control/protection: Condom, Post-menopausal  Other Topics Concern  . None  Social History Narrative  . None   [facility] 01/09/2018 03/06/2016  Decreased Interest 0 0  Down, Depressed, Hopeless 0 0  PHQ - 2 Score 0 0    Review of Systems  Constitutional: Positive for fatigue. Negative for chills and fever.  HENT: Positive for congestion, sinus pressure and sinus pain. Negative for dental problem.   Respiratory: Positive for cough.       Objective:   Physical Exam  HENT:  Right Ear: Tympanic membrane is erythematous and retracted.  Left Ear: Tympanic membrane is erythematous and retracted.  Nose: Mucosal edema present.  Mouth/Throat: Oropharynx is clear and moist.  Neck:  No thyroid mass and no thyromegaly present.  Cardiovascular: Normal rate, regular rhythm, S1 normal, S2 normal and normal heart sounds.  Pulmonary/Chest: Effort normal and breath sounds normal.  Lungs are clear to auscultation.  Lymphadenopathy:       Head (right side): Tonsillar adenopathy present.       Head (left side): Tonsillar adenopathy present.  Skin: No rash noted.   BP 132/82   Pulse 87   Temp 98.2 F (36.8 C)  (Oral)   Resp 16   Ht 5' 9.5" (1.765 m)   Wt 237 lb 9.6 oz (107.8 kg)   SpO2 96%   BMI 34.58 kg/m     Assessment & Plan:   1. Annual physical exam   2. Screening for cardiovascular, respiratory, and genitourinary diseases   3. Screening for deficiency anemia   4. Screening for thyroid disorder   5. Routine screening for STI (sexually transmitted infection)   6. Need for prophylactic vaccination and inoculation against influenza   7. Class 1 obesity due to excess calories without serious comorbidity with body mass index (BMI) of 34.0 to 34.9 in adult   8. Sinus congestion     Orders Placed This Encounter  Procedures  . Comprehensive metabolic panel  . Lipid panel  . CBC  . TSH  . Hemoglobin A1c  . Hemoglobin A1c  . POCT urinalysis dipstick    Meds ordered this encounter  Medications  . methylPREDNISolone acetate (DEPO-MEDROL) injection 80 mg    I personally performed the services described in this documentation, which was scribed in my presence. The recorded information has been reviewed and considered, and addended by me as needed.   [name], M.D.  Primary Care at [facility] [address] [place], [place] [address] [phone] phone [phone] fax  01/12/18 1:16 PM

## 2018-01-09 NOTE — Patient Instructions (Addendum)
Make sure you are taking a daily calcium/vitamin D supplement. Try to find a chewable calcium supplement with 400 to 600 mg of calcium in it and as much vitamin D as you can.  Take this at least once a day, and not with other calcium sources for maximum absorption.   Hot showers or breathing in steam may help loosen the congestion.  Using a netti pot or sinus rinse is also likely to help you feel better and keep this from progressing.  Use the fluticasone nasal spray every night before bed for at least 2 weeks.  I recommend augmenting with 12 hr sudafed (behind the counter) and generic mucinex to help you move out the congestion.  If no improvement or you are getting worse, come back.  You can drop by here or a pharmacy to get your flu shot next week.  IF you received an x-ray today, you will receive an invoice from [facility]. Please contact [facility] at [phone] with questions or concerns regarding your invoice.   IF you received labwork today, you will receive an invoice from [facility]. Please contact [facility] at [phone] with questions or concerns regarding your invoice.   Our billing staff will not be able to assist you with questions regarding bills from these companies.  You will be contacted with the lab results as soon as they are available. The fastest way to get your results is to activate your My Chart account. Instructions are located on the last page of this paperwork. If you have not heard from [place] regarding the results in 2 weeks, please contact this office.     Preventing Unhealthy Weight Gain, Adult Staying at a healthy weight is important. When fat builds up in your body, you may become overweight or obese. These conditions put you at greater risk for developing certain health problems, such as heart disease, diabetes, sleeping problems, joint problems, and some cancers. Unhealthy weight gain is often the result of making unhealthy choices in what you  eat. It is also a result of not getting enough exercise. You can make changes to your lifestyle to prevent obesity and stay as healthy as possible. What nutrition changes can be made? To maintain a healthy weight and prevent obesity:  Eat only as much as your body needs. To do this: ? Pay attention to signs that you are hungry or full. Stop eating as soon as you feel full. ? If you feel hungry, try drinking water first. Drink enough water so your urine is clear or pale yellow. ? Eat smaller portions. ? Look at serving sizes on food labels. Most foods contain more than one serving per container. ? Eat the recommended amount of calories for your gender and activity level. While most active people should eat around 2,000 calories per day, if you are trying to lose weight or are not very active, you main need to eat less calories. Talk to your health care provider or dietitian about how many calories you should eat each day.  Choose healthy foods, such as: ? Fruits and vegetables. Try to fill at least half of your plate at each meal with fruits and vegetables. ? Whole grains, such as whole wheat bread, brown rice, and quinoa. ? Lean meats, such as chicken or fish. ? Other healthy proteins, such as beans, eggs, or tofu. ? Healthy fats, such as nuts, seeds, fatty fish, and olive oil. ? Low-fat or fat-free dairy.  Check food labels and avoid food and drinks  that: ? Are high in calories. ? Have added sugar. ? Are high in sodium. ? Have saturated fats or trans fats.  Limit how much you eat of the following foods: ? Prepackaged meals. ? Fast food. ? Fried foods. ? Processed meat, such as bacon, sausage, and deli meats. ? Fatty cuts of red meat and poultry with skin.  Cook foods in healthier ways, such as by baking, broiling, or grilling.  When grocery shopping, try to shop around the outside of the store. This helps you buy mostly fresh foods and avoid canned and prepackaged foods.  What  lifestyle changes can be made?  Exercise at least 30 minutes 5 or more days each week. Exercising includes brisk walking, yard work, biking, running, swimming, and team sports like basketball and soccer. Ask your health care provider which exercises are safe for you.  Do not use any products that contain nicotine or tobacco, such as cigarettes and e-cigarettes. If you need help quitting, ask your health care provider.  Limit alcohol intake to no more than 1 drink a day for nonpregnant women and 2 drinks a day for men. One drink equals 12 oz of beer, 5 oz of wine, or 1 oz of hard liquor.  Try to get 7-9 hours of sleep each night. What other changes can be made?  Keep a food and activity journal to keep track of: ? What you ate and how many calories you had. Remember to count sauces, dressings, and side dishes. ? Whether you were active, and what exercises you did. ? Your calorie, weight, and activity goals.  Check your weight regularly. Track any changes. If you notice you have gained weight, make changes to your diet or activity routine.  Avoid taking weight-loss medicines or supplements. Talk to your health care provider before starting any new medicine or supplement.  Talk to your health care provider before trying any new diet or exercise plan. Why are these changes important? Eating healthy, staying active, and having healthy habits not only help prevent obesity, they also:  Help you to manage stress and emotions.  Help you to connect with friends and family.  Improve your self-esteem.  Improve your sleep.  Prevent long-term health problems.  What can happen if changes are not made? Being obese or overweight can cause you to develop joint or bone problems, which can make it hard for you to stay active or do activities you enjoy. Being obese or overweight also puts stress on your heart and lungs and can lead to health problems like diabetes, heart disease, and some  cancers. Where to find more information: Talk with your health care provider or a dietitian about healthy eating and healthy lifestyle choices. You may also find other information through these resources:  U.[place]. Department of Agriculture MyPlate: FormerBoss.no  American Heart Association: www.heart.org  Centers for Disease Control and Prevention: http://www.wolf.info/  Summary  Staying at a healthy weight is important. It helps prevent certain diseases and health problems, such as heart disease, diabetes, joint problems, sleep disorders, and some cancers.  Being obese or overweight can cause you to develop joint or bone problems, which can make it hard for you to stay active or do activities you enjoy.  You can prevent unhealthy weight gain by eating a healthy diet, exercising regularly, not smoking, limiting alcohol, and getting enough sleep.  Talk with your health care provider or a dietitian for guidance about healthy eating and healthy lifestyle choices. This information is not intended  to replace advice given to you by your health care provider. Make sure you discuss any questions you have with your health care provider. Document Released: 12/12/2016 Document Revised: 01/17/2017 Document Reviewed: 01/17/2017 Elsevier Interactive Patient Education  2018 [place] Maintenance for Postmenopausal Women Menopause is a normal process in which your reproductive ability comes to an end. This process happens gradually over a span of months to years, usually between the ages of 35 and 25. Menopause is complete when you have missed 12 consecutive menstrual periods. It is important to talk with your health care provider about some of the most common conditions that affect postmenopausal women, such as heart disease, cancer, and bone loss (osteoporosis). Adopting a healthy lifestyle and getting preventive care can help to promote your health and wellness. Those actions can also lower your  chances of developing some of these common conditions. What should I know about menopause? During menopause, you may experience a number of symptoms, such as:  Moderate-to-severe hot flashes.  Night sweats.  Decrease in sex drive.  Mood swings.  Headaches.  Tiredness.  Irritability.  Memory problems.  Insomnia.  Choosing to treat or not to treat menopausal changes is an individual decision that you make with your health care provider. What should I know about hormone replacement therapy and supplements? Hormone therapy products are effective for treating symptoms that are associated with menopause, such as hot flashes and night sweats. Hormone replacement carries certain risks, especially as you become older. If you are thinking about using estrogen or estrogen with progestin treatments, discuss the benefits and risks with your health care provider. What should I know about heart disease and stroke? Heart disease, heart attack, and stroke become more likely as you age. This may be due, in part, to the hormonal changes that your body experiences during menopause. These can affect how your body processes dietary fats, triglycerides, and cholesterol. Heart attack and stroke are both medical emergencies. There are many things that you can do to help prevent heart disease and stroke:  Have your blood pressure checked at least every 1-2 years. High blood pressure causes heart disease and increases the risk of stroke.  If you are 49-1 years old, ask your health care provider if you should take aspirin to prevent a heart attack or a stroke.  Do not use any tobacco products, including cigarettes, chewing tobacco, or electronic cigarettes. If you need help quitting, ask your health care provider.  It is important to eat a healthy diet and maintain a healthy weight. ? Be sure to include plenty of vegetables, fruits, low-fat dairy products, and lean protein. ? Avoid eating foods that are  high in solid fats, added sugars, or salt (sodium).  Get regular exercise. This is one of the most important things that you can do for your health. ? Try to exercise for at least 150 minutes each week. The type of exercise that you do should increase your heart rate and make you sweat. This is known as moderate-intensity exercise. ? Try to do strengthening exercises at least twice each week. Do these in addition to the moderate-intensity exercise.  Know your numbers.Ask your health care provider to check your cholesterol and your blood glucose. Continue to have your blood tested as directed by your health care provider.  What should I know about cancer screening? There are several types of cancer. Take the following steps to reduce your risk and to catch any cancer development as early as possible.  Breast Cancer  Practice breast self-awareness. ? This means understanding how your breasts normally appear and feel. ? It also means doing regular breast self-exams. Let your health care provider know about any changes, no matter how small.  If you are 91 or older, have a clinician do a breast exam (clinical breast exam or CBE) every year. Depending on your age, family history, and medical history, it may be recommended that you also have a yearly breast X-ray (mammogram).  If you have a family history of breast cancer, talk with your health care provider about genetic screening.  If you are at high risk for breast cancer, talk with your health care provider about having an MRI and a mammogram every year.  Breast cancer (BRCA) gene test is recommended for women who have family members with BRCA-related cancers. Results of the assessment will determine the need for genetic counseling and BRCA1 and for BRCA2 testing. BRCA-related cancers include these types: ? Breast. This occurs in males or females. ? Ovarian. ? Tubal. This may also be called fallopian tube cancer. ? Cancer of the abdominal or  pelvic lining (peritoneal cancer). ? Prostate. ? Pancreatic.  Cervical, Uterine, and Ovarian Cancer Your health care provider may recommend that you be screened regularly for cancer of the pelvic organs. These include your ovaries, uterus, and vagina. This screening involves a pelvic exam, which includes checking for microscopic changes to the surface of your cervix (Pap test).  For women ages 21-65, health care providers may recommend a pelvic exam and a Pap test every three years. For women ages 90-65, they may recommend the Pap test and pelvic exam, combined with testing for human papilloma virus (HPV), every five years. Some types of HPV increase your risk of cervical cancer. Testing for HPV may also be done on women of any age who have unclear Pap test results.  Other health care providers may not recommend any screening for nonpregnant women who are considered low risk for pelvic cancer and have no symptoms. Ask your health care provider if a screening pelvic exam is right for you.  If you have had past treatment for cervical cancer or a condition that could lead to cancer, you need Pap tests and screening for cancer for at least 20 years after your treatment. If Pap tests have been discontinued for you, your risk factors (such as having a new sexual partner) need to be reassessed to determine if you should start having screenings again. Some women have medical problems that increase the chance of getting cervical cancer. In these cases, your health care provider may recommend that you have screening and Pap tests more often.  If you have a family history of uterine cancer or ovarian cancer, talk with your health care provider about genetic screening.  If you have vaginal bleeding after reaching menopause, tell your health care provider.  There are currently no reliable tests available to screen for ovarian cancer.  Lung Cancer Lung cancer screening is recommended for adults 78-13 years old  who are at high risk for lung cancer because of a history of smoking. A yearly low-dose CT scan of the lungs is recommended if you:  Currently smoke.  Have a history of at least 30 pack-years of smoking and you currently smoke or have quit within the past 15 years. A pack-year is smoking an average of one pack of cigarettes per day for one year.  Yearly screening should:  Continue until it has been 15 years since  you quit.  Stop if you develop a health problem that would prevent you from having lung cancer treatment.  Colorectal Cancer  This type of cancer can be detected and can often be prevented.  Routine colorectal cancer screening usually begins at age 32 and continues through age 64.  If you have risk factors for colon cancer, your health care provider may recommend that you be screened at an earlier age.  If you have a family history of colorectal cancer, talk with your health care provider about genetic screening.  Your health care provider may also recommend using home test kits to check for hidden blood in your stool.  A small camera at the end of a tube can be used to examine your colon directly (sigmoidoscopy or colonoscopy). This is done to check for the earliest forms of colorectal cancer.  Direct examination of the colon should be repeated every 5-10 years until age 45. However, if early forms of precancerous polyps or small growths are found or if you have a family history or genetic risk for colorectal cancer, you may need to be screened more often.  Skin Cancer  Check your skin from head to toe regularly.  Monitor any moles. Be sure to tell your health care provider: ? About any new moles or changes in moles, especially if there is a change in a mole's shape or color. ? If you have a mole that is larger than the size of a pencil eraser.  If any of your family members has a history of skin cancer, especially at a young age, talk with your health care provider  about genetic screening.  Always use sunscreen. Apply sunscreen liberally and repeatedly throughout the day.  Whenever you are outside, protect yourself by wearing long sleeves, pants, a wide-brimmed hat, and sunglasses.  What should I know about osteoporosis? Osteoporosis is a condition in which bone destruction happens more quickly than new bone creation. After menopause, you may be at an increased risk for osteoporosis. To help prevent osteoporosis or the bone fractures that can happen because of osteoporosis, the following is recommended:  If you are 36-73 years old, get at least 1,000 mg of calcium and at least 600 mg of vitamin D per day.  If you are older than age 33 but younger than age 4, get at least 1,200 mg of calcium and at least 600 mg of vitamin D per day.  If you are older than age 78, get at least 1,200 mg of calcium and at least 800 mg of vitamin D per day.  Smoking and excessive alcohol intake increase the risk of osteoporosis. Eat foods that are rich in calcium and vitamin D, and do weight-bearing exercises several times each week as directed by your health care provider. What should I know about how menopause affects my mental health? Depression may occur at any age, but it is more common as you become older. Common symptoms of depression include:  Low or sad mood.  Changes in sleep patterns.  Changes in appetite or eating patterns.  Feeling an overall lack of motivation or enjoyment of activities that you previously enjoyed.  Frequent crying spells.  Talk with your health care provider if you think that you are experiencing depression. What should I know about immunizations? It is important that you get and maintain your immunizations. These include:  Tetanus, diphtheria, and pertussis (Tdap) booster vaccine.  Influenza every year before the flu season begins.  Pneumonia vaccine.  Shingles vaccine.  Your health care provider may also recommend other  immunizations. This information is not intended to replace advice given to you by your health care provider. Make sure you discuss any questions you have with your health care provider. Document Released: 02/02/2006 Document Revised: 06/30/2016 Document Reviewed: 09/14/2015 Elsevier Interactive Patient Education  2018 [employer].

## 2018-01-10 LAB — COMPREHENSIVE METABOLIC PANEL
ALT: 13 IU/L (ref 0–32)
AST: 15 IU/L (ref 0–40)
Albumin/Globulin Ratio: 1.2 (ref 1.2–2.2)
Albumin: 4.1 g/dL (ref 3.5–5.5)
Alkaline Phosphatase: 101 IU/L (ref 39–117)
BUN/Creatinine Ratio: 11 (ref 9–23)
BUN: 10 mg/dL (ref 6–24)
Bilirubin Total: 0.4 mg/dL (ref 0.0–1.2)
CO2: 25 mmol/L (ref 20–29)
Calcium: 9.3 mg/dL (ref 8.7–10.2)
Chloride: 103 mmol/L (ref 96–106)
Creatinine, Ser: 0.93 mg/dL (ref 0.57–1.00)
GFR calc Af Amer: 81 mL/min/{1.73_m2} (ref >59)
GFR calc non Af Amer: 70 mL/min/{1.73_m2} (ref >59)
Globulin, Total: 3.5 g/dL (ref 1.5–4.5)
Glucose: 81 mg/dL (ref 65–99)
Potassium: 4 mmol/L (ref 3.5–5.2)
Sodium: 140 mmol/L (ref 134–144)
Total Protein: 7.6 g/dL (ref 6.0–8.5)

## 2018-01-10 LAB — LIPID PANEL
Chol/HDL Ratio: 5 ratio — ABNORMAL HIGH (ref 0.0–4.4)
Cholesterol, Total: 249 mg/dL — ABNORMAL HIGH (ref 100–199)
HDL: 50 mg/dL (ref >39)
LDL Calculated: 189 mg/dL — ABNORMAL HIGH (ref 0–99)
Triglycerides: 51 mg/dL (ref 0–149)
VLDL Cholesterol Cal: 10 mg/dL (ref 5–40)

## 2018-01-10 LAB — CBC
Hematocrit: 39.6 % (ref 34.0–46.6)
Hemoglobin: 13 g/dL (ref 11.1–15.9)
MCH: 29 pg (ref 26.6–33.0)
MCHC: 32.8 g/dL (ref 31.5–35.7)
MCV: 88 fL (ref 79–97)
Platelets: 324 10*3/uL (ref 150–379)
RBC: 4.49 x10E6/uL (ref 3.77–5.28)
RDW: 13.8 % (ref 12.3–15.4)
WBC: 5.8 10*3/uL (ref 3.4–10.8)

## 2018-01-10 LAB — HEMOGLOBIN A1C
Est. average glucose Bld gHb Est-mCnc: 114 mg/dL
Hgb A1c MFr Bld: 5.6 % (ref 4.8–5.6)

## 2018-01-10 LAB — TSH: TSH: 0.664 u[IU]/mL (ref 0.450–4.500)

## 2018-01-31 ENCOUNTER — Telehealth: Payer: Self-pay | Admitting: Family Medicine

## 2018-01-31 NOTE — Telephone Encounter (Signed)
Copied from CRM [phone]. Topic: Quick Communication - See Telephone Encounter >> Jan 31, 2018  4:55 PM Joana ReamerWarren, Amber N wrote: CRM for notification. See Telephone encounter for: 01/31/18. Patient called office requesting lab results. Please advise.

## 2018-02-01 NOTE — Telephone Encounter (Signed)
Pt notified lab results were sent to her Mychart

## 2018-03-06 LAB — RESULTS CONSOLE HPV: CHL HPV: NEGATIVE

## 2018-03-06 LAB — HM PAP SMEAR

## 2019-11-14 LAB — HM MAMMOGRAPHY

## 2020-01-15 ENCOUNTER — Ambulatory Visit (INDEPENDENT_AMBULATORY_CARE_PROVIDER_SITE_OTHER): Payer: 59 | Admitting: Family Medicine

## 2020-01-15 ENCOUNTER — Other Ambulatory Visit: Payer: Self-pay

## 2020-01-15 ENCOUNTER — Encounter: Payer: Self-pay | Admitting: Family Medicine

## 2020-01-15 VITALS — BP 138/70 | HR 89 | Temp 98.0°F | Ht 70.5 in | Wt 236.4 lb

## 2020-01-15 DIAGNOSIS — Z131 Encounter for screening for diabetes mellitus: Secondary | ICD-10-CM

## 2020-01-15 DIAGNOSIS — Z23 Encounter for immunization: Secondary | ICD-10-CM | POA: Diagnosis not present

## 2020-01-15 DIAGNOSIS — Z13220 Encounter for screening for lipoid disorders: Secondary | ICD-10-CM

## 2020-01-15 DIAGNOSIS — Z130 Encounter for screening for diseases of the blood and blood-forming organs and certain disorders involving the immune mechanism: Secondary | ICD-10-CM

## 2020-01-15 DIAGNOSIS — Z1329 Encounter for screening for other suspected endocrine disorder: Secondary | ICD-10-CM

## 2020-01-15 DIAGNOSIS — Z0000 Encounter for general adult medical examination without abnormal findings: Secondary | ICD-10-CM | POA: Diagnosis not present

## 2020-01-15 DIAGNOSIS — Z13228 Encounter for screening for other metabolic disorders: Secondary | ICD-10-CM

## 2020-01-15 NOTE — Progress Notes (Signed)
1/21/20211:16 PM  [name] Sep 22, 1961, 59 y.o., female [id number]  Chief Complaint  Patient presents with  . Establish Care    HPI:   Patient is a 59 y.o. female  who presents today for CPE  Cervical Cancer Screening: used to see Dr Raphael Gibney, was doing paps every years, reports normal, did not have one done last year, colpo in 2015 CIN 1 Breast Cancer Screening: nov 2020, normal Colorectal Cancer Screening: colonoscopy in 2015, normal, repeat in 10 years Bone Density Testing: at age 37 HIV Screening: 2017 STI Screening: 2017 Seasonal Influenza Vaccination: today Td/Tdap Vaccination: 2017 Pneumococcal Vaccination: at age 36 Zoster Vaccination: at pharmacy of choice Frequency of Dental evaluation: Q6 months Frequency of Eye evaluation: yearly, wears eyeglassses  Lab Results  Component Value Date   CHOL 249 (H) 01/09/2018   HDL 50 01/09/2018   LDLCALC 189 (H) 01/09/2018   LDLDIRECT 206 (H) 03/06/2016   TRIG 51 01/09/2018   CHOLHDL 5.0 (H) 01/09/2018    Depression screen PHQ 2/9 01/15/2020 01/09/2018 03/06/2016  Decreased Interest 0 0 0  Down, Depressed, Hopeless 0 0 0  PHQ - 2 Score 0 0 0    Fall Risk  01/15/2020 01/09/2018  Falls in the past year? 0 No  Number falls in past yr: 0 -  Injury with Fall? 0 -  Follow up Falls evaluation completed -     Allergies  Allergen Reactions  . Floxin [Ofloxacin] Rash    Prior to Admission medications   Not on File    Past Medical History:  Diagnosis Date  . Allergy     No past surgical history on file.  Social History   Tobacco Use  . Smoking status: Never Smoker  . Smokeless tobacco: Never Used  Substance Use Topics  . Alcohol use: No    Family History  Problem Relation Age of Onset  . Heart disease Mother   . Stroke Sister     Review of Systems  Constitutional: Negative for chills and fever.  Respiratory: Negative for cough and shortness of breath.   Cardiovascular: Negative for chest pain,  palpitations and leg swelling.  Gastrointestinal: Positive for constipation (manages with metamucil). Negative for abdominal pain, nausea and vomiting.  Neurological: Positive for headaches (sinus related).  All other systems reviewed and are negative.  Per hpi  OBJECTIVE:  Today's Vitals   01/15/20 1314  BP: 138/70  Pulse: 89  Temp: 98 F (36.7 C)  TempSrc: Temporal  SpO2: 94%  Weight: 236 lb 6.4 oz (107.2 kg)  Height: 5' 10.5" (1.791 m)   Body mass index is 33.44 kg/m.  Wt Readings from Last 3 Encounters:  01/15/20 236 lb 6.4 oz (107.2 kg)  01/09/18 237 lb 9.6 oz (107.8 kg)  03/06/16 231 lb (104.8 kg)    Physical Exam Vitals and nursing note reviewed.  Constitutional:      Appearance: She is well-developed.  HENT:     Head: Normocephalic and atraumatic.     Right Ear: Hearing, tympanic membrane, ear canal and external ear normal.     Left Ear: Hearing, tympanic membrane, ear canal and external ear normal.     Mouth/Throat:     Mouth: Mucous membranes are moist.     Pharynx: No oropharyngeal exudate or posterior oropharyngeal erythema.  Eyes:     Extraocular Movements: Extraocular movements intact.     Conjunctiva/sclera: Conjunctivae normal.     Pupils: Pupils are equal, round, and reactive to light.  Neck:  Thyroid: No thyromegaly.  Cardiovascular:     Rate and Rhythm: Normal rate and regular rhythm.     Heart sounds: Normal heart sounds. No murmur. No friction rub. No gallop.   Pulmonary:     Effort: Pulmonary effort is normal.     Breath sounds: Normal breath sounds. No wheezing, rhonchi or rales.  Abdominal:     General: Bowel sounds are normal. There is no distension.     Palpations: Abdomen is soft. There is no hepatomegaly, splenomegaly or mass.     Tenderness: There is no abdominal tenderness.  Musculoskeletal:        General: Normal range of motion.     Cervical back: Neck supple.     Right lower leg: No edema.     Left lower leg: No edema.   Lymphadenopathy:     Cervical: No cervical adenopathy.  Skin:    General: Skin is warm and dry.  Neurological:     Mental Status: She is alert and oriented to person, place, and time.     Cranial Nerves: No cranial nerve deficit.     Gait: Gait normal.     Deep Tendon Reflexes: Reflexes are normal and symmetric.  Psychiatric:        Mood and Affect: Mood normal.        Behavior: Behavior normal.     No results found for this or any previous visit (from the past 24 hour(s)).  No results found.   ASSESSMENT and PLAN  1. Annual physical exam Routine HCM labs ordered. HCM reviewed/discussed. Anticipatory guidance regarding healthy weight, lifestyle and choices given.   2. Need for prophylactic vaccination and inoculation against influenza - Flu Vaccine QUAD 36+ mos IM  3. Screening for deficiency anemia - CBC  4. Screening for endocrine, metabolic and immunity disorder - Comprehensive metabolic panel - TSH  5. Screening for lipid disorders - Lipid panel  6. Screening for diabetes mellitus - Hemoglobin A1c  Other orders   Return for for pap.    Myles Lipps, MD [facility] at [facility] [address] [place], [place] [address] Ph.  [phone] Fax [phone]

## 2020-01-15 NOTE — Patient Instructions (Addendum)
I recommend daily calcium intake of 1200mg , best from food sources, daily vitamin D intake of at least 2000 units.   High Cholesterol  High cholesterol is a condition in which the blood has high levels of a white, waxy, fat-like substance (cholesterol). The human body needs small amounts of cholesterol. The liver makes all the cholesterol that the body needs. Extra (excess) cholesterol comes from the food that we eat. Cholesterol is carried from the liver by the blood through the blood vessels. If you have high cholesterol, deposits (plaques) may build up on the walls of your blood vessels (arteries). Plaques make the arteries narrower and stiffer. Cholesterol plaques increase your risk for heart attack and stroke. Work with your health care provider to keep your cholesterol levels in a healthy range. What increases the risk? This condition is more likely to develop in people who:  Eat foods that are high in animal fat (saturated fat) or cholesterol.  Are overweight.  Are not getting enough exercise.  Have a family history of high cholesterol. What are the signs or symptoms? There are no symptoms of this condition. How is this diagnosed? This condition may be diagnosed from the results of a blood test.  If you are older than age 60, your health care provider may check your cholesterol every 4-6 years.  You may be checked more often if you already have high cholesterol or other risk factors for heart disease. The blood test for cholesterol measures:  "Bad" cholesterol (LDL cholesterol). This is the main type of cholesterol that causes heart disease. The desired level for LDL is less than 100.  "Good" cholesterol (HDL cholesterol). This type helps to protect against heart disease by cleaning the arteries and carrying the LDL away. The desired level for HDL is 60 or higher.  Triglycerides. These are fats that the body can store or burn for energy. The desired number for triglycerides is  lower than 150.  Total cholesterol. This is a measure of the total amount of cholesterol in your blood, including LDL cholesterol, HDL cholesterol, and triglycerides. A healthy number is less than 200. How is this treated? This condition is treated with diet changes, lifestyle changes, and medicines. Diet changes  This may include eating more whole grains, fruits, vegetables, nuts, and fish.  This may also include cutting back on red meat and foods that have a lot of added sugar. Lifestyle changes  Changes may include getting at least 40 minutes of aerobic exercise 3 times a week. Aerobic exercises include walking, biking, and swimming. Aerobic exercise along with a healthy diet can help you maintain a healthy weight.  Changes may also include quitting smoking. Medicines  Medicines are usually given if diet and lifestyle changes have failed to reduce your cholesterol to healthy levels.  Your health care provider may prescribe a statin medicine. Statin medicines have been shown to reduce cholesterol, which can reduce the risk of heart disease. Follow these instructions at home: Eating and drinking If told by your health care provider:  Eat chicken (without skin), fish, veal, shellfish, ground 26 breast, and round or loin cuts of red meat.  Do not eat fried foods or fatty meats, such as hot dogs and salami.  Eat plenty of fruits, such as apples.  Eat plenty of vegetables, such as broccoli, potatoes, and carrots.  Eat beans, peas, and lentils.  Eat grains such as barley, rice, couscous, and bulgur wheat.  Eat pasta without cream sauces.  Use skim or nonfat  milk, and eat low-fat or nonfat yogurt and cheeses.  Do not eat or drink whole milk, cream, ice cream, egg yolks, or hard cheeses.  Do not eat stick margarine or tub margarines that contain trans fats (also called partially hydrogenated oils).  Do not eat saturated tropical oils, such as coconut oil and palm oil.  Do  not eat cakes, cookies, crackers, or other baked goods that contain trans fats.  General instructions  Exercise as directed by your health care provider. Increase your activity level with activities such as gardening, walking, and taking the stairs.  Take over-the-counter and prescription medicines only as told by your health care provider.  Do not use any products that contain nicotine or tobacco, such as cigarettes and e-cigarettes. If you need help quitting, ask your health care provider.  Keep all follow-up visits as told by your health care provider. This is important. Contact a health care provider if:  You are struggling to maintain a healthy diet or weight.  You need help to start on an exercise program.  You need help to stop smoking. Get help right away if:  You have chest pain.  You have trouble breathing. This information is not intended to replace advice given to you by your health care provider. Make sure you discuss any questions you have with your health care provider. Document Revised: 12/14/2017 Document Reviewed: 06/10/2016 Elsevier Patient Education  2020 Elsevier Inc.   Vitamin D Deficiency Vitamin D deficiency is when your body does not have enough vitamin D. Vitamin D is important to your body for many reasons:  It helps the body absorb two important minerals--calcium and phosphorus.  It plays a role in bone health.  It may help to prevent some diseases, such as diabetes and multiple sclerosis.  It plays a role in muscle function, including heart function. If vitamin D deficiency is severe, it can cause a condition in which your bones become soft. In adults, this condition is called osteomalacia. In children, this condition is called rickets. What are the causes? This condition may be caused by:  Not eating enough foods that contain vitamin D.  Not getting enough natural sun exposure.  Having certain digestive system diseases that make it  difficult for your body to absorb vitamin D. These diseases include Crohn's disease, chronic pancreatitis, and cystic fibrosis.  Having a surgery in which a part of the stomach or a part of the small intestine is removed.  Having chronic kidney disease or liver disease. What increases the risk? You are more likely to develop this condition if you:  Are older.  Do not spend much time outdoors.  Live in a long-term care facility.  Have had broken bones.  Have weak or thin bones (osteoporosis).  Have a disease or condition that changes how the body absorbs vitamin D.  Have dark skin.  Take certain medicines, such as steroid medicines or certain seizure medicines.  Are overweight or obese. What are the signs or symptoms? In mild cases of vitamin D deficiency, there may not be any symptoms. If the condition is severe, symptoms may include:  Bone pain.  Muscle pain.  Falling often.  Broken bones caused by a minor injury. How is this diagnosed? This condition may be diagnosed with blood tests. Imaging tests such as X-rays may also be done to look for changes in the bone. How is this treated? Treatment for this condition may depend on what caused the condition. Treatment options include:  Taking  vitamin D supplements. Your health care provider will suggest what dose is best for you.  Taking a calcium supplement. Your health care provider will suggest what dose is best for you. Follow these instructions at home: Eating and drinking   Eat foods that contain vitamin D. Choices include: ? Fortified dairy products, cereals, or juices. Fortified means that vitamin D has been added to the food. Check the label on the package to see if the food is fortified. ? Fatty fish, such as salmon or trout. ? Eggs. ? Oysters. ? Mushrooms. The items listed above may not be a complete list of recommended foods and beverages. Contact a dietitian for more information. General  instructions  Take medicines and supplements only as told by your health care provider.  Get regular, safe exposure to natural sunlight.  Do not use a tanning bed.  Maintain a healthy weight. Lose weight if needed.  Keep all follow-up visits as told by your health care provider. This is important. How is this prevented? You can get vitamin D by:  Eating foods that naturally contain vitamin D.  Eating or drinking products that have been fortified with vitamin D, such as cereals, juices, and dairy products (including milk).  Taking a vitamin D supplement or a multivitamin supplement that contains vitamin D.  Being in the sun. Your body naturally makes vitamin D when your skin is exposed to sunlight. Your body changes the sunlight into a form of the vitamin that it can use. Contact a health care provider if:  Your symptoms do not go away.  You feel nauseous or you vomit.  You have fewer bowel movements than usual or are constipated. Summary  Vitamin D deficiency is when your body does not have enough vitamin D.  Vitamin D is important to your body for good bone health and muscle function, and it may help prevent some diseases.  Vitamin D deficiency is primarily treated through supplementation. Your health care provider will suggest what dose is best for you.  You can get vitamin D by eating foods that contain vitamin D, by being in the sun, and by taking a vitamin D supplement or a multivitamin supplement that contains vitamin D. This information is not intended to replace advice given to you by your health care provider. Make sure you discuss any questions you have with your health care provider. Document Revised: 08/19/2018 Document Reviewed: 08/19/2018 Elsevier Patient Education  2020 Elsevier Inc.  Calcium Content in Foods Calcium is the most abundant mineral in your body. Most of your body's calcium supply is stored in your bones and teeth. Calcium helps many parts of the  body function normally, including:  Blood and blood vessels.  Nerves.  Hormones.  Muscles.  Bones and teeth. When your calcium stores are low, you may be at risk for low bone mass, bone loss, and broken bones (fractures). When you get enough calcium, it helps to support strong bones and teeth throughout your life. Calcium is especially important for:  Children during growth spurts.  Girls during adolescence.  Women who are pregnant or breastfeeding.  Women after their menstrual cycle stops (postmenopause).  Women whose menstrual cycle has stopped due to anorexia nervosa or regular intense exercise.  People who cannot eat or digest dairy products.  Vegans. What are tips for getting more calcium? General information  Try to get most of your calcium from food. Eat foods that are high in calcium.  Some people may benefit from taking calcium  supplements. Check with your health care provider or diet and nutrition specialist (dietitian) before starting any calcium supplements. Calcium supplements may interact with certain medicines. Too much calcium may cause other health problems, like constipation and kidney stones.  For the body to absorb calcium, it needs vitamin D. Sources of vitamin D include: ? Skin exposure to direct sunlight. ? Foods, such as egg yolks, liver, saltwater fish, and fortified milk. ? Vitamin D supplements. Check with your health care provider or dietitian before starting any vitamin D supplements. What foods are high in calcium?  High-calcium foods are those that contain more than 100 milligrams (mg) of calcium per serving. Fruits  Fortified orange or other fruit juice, 300 mg per 8 oz serving. Vegetables  Collard greens, 360 mg per 8 oz serving.  Kale, 180 mg per 8 oz serving.  Bok choy, 160 mg per 8 oz serving. Grains  Fortified ready-to-eat cereals, 100-1,000 mg per 8 oz serving.  Fortified frozen waffles, 200 mg in two waffles. Meats and  other proteins  Sardines, canned with bones, 325 mg per 3 oz serving.  Salmon, canned with bones, 180 mg per 3 oz serving.  Canned shrimp, 125 mg per 3 oz serving.  Baked beans, 160 mg per 4 oz serving. Dairy  Yogurt, plain, low-fat, 310 mg per 6 oz serving.  Milk, 300 mg per 8 oz serving.  American cheese, 195 mg per 1 oz serving.  Cheddar cheese, 205 mg per 1 oz serving.  Cottage cheese 2%, 105 mg per 4 oz serving.  Fortified soy, rice, or almond milk, 300 mg per 8 oz serving. The items listed above may not be a complete list of foods high in calcium. Actual amounts of calcium may be different depending on processing. Contact a dietitian for more information. What foods are lower in calcium? Foods lower in calcium are those that contain 50 mg of calcium or less per serving. Fruits  Apple, about 6 mg in one apple.  Banana, about 12 mg in one banana. Vegetables  Lettuce, 19 mg per 2 oz serving.  Tomato, about 11 mg in one tomato. Grains  Rice, 4 mg per 6 oz serving.  Boiled potatoes, 14 mg per 8 oz serving.  White bread, 6 mg in one slice. Meats and other proteins  Egg, 27 mg per 2 oz serving.  Red meat, 7 mg per 4 oz serving.  Chicken, 17 mg per 4 oz serving.  Fish, cod or trout, 20 mg per 4 oz serving. The items listed above may not be a complete list of foods lower in calcium. Actual amounts of calcium may be different depending on processing. Contact a dietitian for more information. Summary  Calcium is an important mineral in the body because it affects many functions. Getting enough calcium helps support strong bones and teeth throughout your life.  Try to get most of your calcium from food.  Calcium supplements may interact with certain medicines. Check with your health care provider before starting any calcium supplements. This information is not intended to replace advice given to you by your health care provider. Make sure you discuss any questions  you have with your health care provider. Document Revised: 12/04/2017 Document Reviewed: 12/04/2017 Elsevier Patient Education  [employer].   If you have lab work done today you will be contacted with your lab results within the next 2 weeks.  If you have not heard from [place] then please contact us. The fastest way to get  your results is to register for My Chart.   IF you received an x-ray today, you will receive an invoice from [facility]. Please contact [facility] at [phone] with questions or concerns regarding your invoice.   IF you received labwork today, you will receive an invoice from [facility]. Please contact [facility] at [phone] with questions or concerns regarding your invoice.   Our billing staff will not be able to assist you with questions regarding bills from these companies.  You will be contacted with the lab results as soon as they are available. The fastest way to get your results is to activate your My Chart account. Instructions are located on the last page of this paperwork. If you have not heard from [place] regarding the results in 2 weeks, please contact this office.

## 2020-01-16 LAB — LIPID PANEL
Chol/HDL Ratio: 4.5 ratio — ABNORMAL HIGH (ref 0.0–4.4)
Cholesterol, Total: 232 mg/dL — ABNORMAL HIGH (ref 100–199)
HDL: 52 mg/dL (ref >39)
LDL Chol Calc (NIH): 167 mg/dL — ABNORMAL HIGH (ref 0–99)
Triglycerides: 77 mg/dL (ref 0–149)
VLDL Cholesterol Cal: 13 mg/dL (ref 5–40)

## 2020-01-16 LAB — CBC
Hematocrit: 40.3 % (ref 34.0–46.6)
Hemoglobin: 13.6 g/dL (ref 11.1–15.9)
MCH: 29.8 pg (ref 26.6–33.0)
MCHC: 33.7 g/dL (ref 31.5–35.7)
MCV: 88 fL (ref 79–97)
Platelets: 333 10*3/uL (ref 150–450)
RBC: 4.56 x10E6/uL (ref 3.77–5.28)
RDW: 13.4 % (ref 11.7–15.4)
WBC: 5.8 10*3/uL (ref 3.4–10.8)

## 2020-01-16 LAB — COMPREHENSIVE METABOLIC PANEL
ALT: 14 IU/L (ref 0–32)
AST: 15 IU/L (ref 0–40)
Albumin/Globulin Ratio: 1.3 (ref 1.2–2.2)
Albumin: 4 g/dL (ref 3.8–4.9)
Alkaline Phosphatase: 108 IU/L (ref 39–117)
BUN/Creatinine Ratio: 19 (ref 9–23)
BUN: 16 mg/dL (ref 6–24)
Bilirubin Total: 0.4 mg/dL (ref 0.0–1.2)
CO2: 26 mmol/L (ref 20–29)
Calcium: 9.2 mg/dL (ref 8.7–10.2)
Chloride: 104 mmol/L (ref 96–106)
Creatinine, Ser: 0.85 mg/dL (ref 0.57–1.00)
GFR calc Af Amer: 89 mL/min/{1.73_m2} (ref >59)
GFR calc non Af Amer: 77 mL/min/{1.73_m2} (ref >59)
Globulin, Total: 3.1 g/dL (ref 1.5–4.5)
Glucose: 86 mg/dL (ref 65–99)
Potassium: 4.5 mmol/L (ref 3.5–5.2)
Sodium: 139 mmol/L (ref 134–144)
Total Protein: 7.1 g/dL (ref 6.0–8.5)

## 2020-01-16 LAB — TSH: TSH: 0.983 u[IU]/mL (ref 0.450–4.500)

## 2020-01-16 LAB — HEMOGLOBIN A1C
Est. average glucose Bld gHb Est-mCnc: 108 mg/dL
Hgb A1c MFr Bld: 5.4 % (ref 4.8–5.6)

## 2020-01-27 ENCOUNTER — Other Ambulatory Visit (HOSPITAL_COMMUNITY)
Admission: RE | Admit: 2020-01-27 | Discharge: 2020-01-27 | Disposition: A | Discharge status: Home / Self Care | Payer: 59 | Source: Ambulatory Visit | Attending: Family Medicine | Admitting: Family Medicine

## 2020-01-27 ENCOUNTER — Ambulatory Visit (INDEPENDENT_AMBULATORY_CARE_PROVIDER_SITE_OTHER): Payer: 59 | Admitting: Family Medicine

## 2020-01-27 ENCOUNTER — Other Ambulatory Visit: Payer: Self-pay

## 2020-01-27 ENCOUNTER — Encounter: Payer: Self-pay | Admitting: Family Medicine

## 2020-01-27 VITALS — BP 116/77 | HR 77 | Temp 97.9°F | Ht 70.5 in | Wt 240.0 lb

## 2020-01-27 DIAGNOSIS — Z124 Encounter for screening for malignant neoplasm of cervix: Secondary | ICD-10-CM

## 2020-01-27 DIAGNOSIS — N870 Mild cervical dysplasia: Secondary | ICD-10-CM | POA: Insufficient documentation

## 2020-01-27 NOTE — Patient Instructions (Addendum)
   If you have lab work done today you will be contacted with your lab results within the next 2 weeks.  If you have not heard from us then please contact us. The fastest way to get your results is to register for My Chart.   IF you received an x-ray today, you will receive an invoice from [facility]. Please contact [facility] at [phone] with questions or concerns regarding your invoice.   IF you received labwork today, you will receive an invoice from [facility]. Please contact [facility] at [phone] with questions or concerns regarding your invoice.   Our billing staff will not be able to assist you with questions regarding bills from these companies.  You will be contacted with the lab results as soon as they are available. The fastest way to get your results is to activate your My Chart account. Instructions are located on the last page of this paperwork. If you have not heard from us regarding the results in 2 weeks, please contact this office.      Pap Test Why am I having this test? A Pap test, also called a Pap smear, is a screening test to check for signs of:  Cancer of the vagina, cervix, and uterus. The cervix is the lower part of the uterus that opens into the vagina.  Infection.  Changes that may be a sign that cancer is developing (precancerous changes). Women need this test on a regular basis. In general, you should have a Pap test every 3 years until you reach menopause or age 65. Women aged 30-60 may choose to have their Pap test done at the same time as an HPV (human papillomavirus) test every 5 years (instead of every 3 years). Your health care provider may recommend having Pap tests more or less often depending on your medical conditions and past Pap test results. What kind of sample is taken?  Your health care provider will collect a sample of cells from the surface of your cervix. This will be done using a small cotton swab, plastic  spatula, or brush. This sample is often collected during a pelvic exam, when you are lying on your back on an exam table with feet in footrests (stirrups). In some cases, fluids (secretions) from the cervix or vagina may also be collected. How do I prepare for this test?  Be aware of where you are in your menstrual cycle. If you are menstruating on the day of the test, you may be asked to reschedule.  You may need to reschedule if you have a known vaginal infection on the day of the test.  Follow instructions from your health care provider about: ? Changing or stopping your regular medicines. Some medicines can cause abnormal test results, such as digitalis and tetracycline. ? Avoiding douching or taking a bath the day before or the day of the test. Tell a health care provider about:  Any allergies you have.  All medicines you are taking, including vitamins, herbs, eye drops, creams, and over-the-counter medicines.  Any blood disorders you have.  Any surgeries you have had.  Any medical conditions you have.  Whether you are pregnant or may be pregnant. How are the results reported? Your test results will be reported as either abnormal or normal. A false-positive result can occur. A false positive is incorrect because it means that a condition is present when it is not. A false-negative result can occur. A false negative is incorrect because it means   that a condition is not present when it is. What do the results mean? A normal test result means that you do not have signs of cancer of the vagina, cervix, or uterus. An abnormal result may mean that you have:  Cancer. A Pap test by itself is not enough to diagnose cancer. You will have more tests done in this case.  Precancerous changes in your vagina, cervix, or uterus.  Inflammation of the cervix.  An STD (sexually transmitted disease).  A fungal infection.  A parasite infection. Talk with your health care provider about  what your results mean. Questions to ask your health care provider Ask your health care provider, or the department that is doing the test:  When will my results be ready?  How will I get my results?  What are my treatment options?  What other tests do I need?  What are my next steps? Summary  In general, women should have a Pap test every 3 years until they reach menopause or age 65.  Your health care provider will collect a sample of cells from the surface of your cervix. This will be done using a small cotton swab, plastic spatula, or brush.  In some cases, fluids (secretions) from the cervix or vagina may also be collected. This information is not intended to replace advice given to you by your health care provider. Make sure you discuss any questions you have with your health care provider. Document Revised: 08/20/2017 Document Reviewed: 08/20/2017 Elsevier Patient Education  2020 Elsevier Inc.  

## 2020-01-27 NOTE — Progress Notes (Signed)
2/2/202111:37 AM  [name] 07/08/1961, 59 y.o., female [id number]  Chief Complaint  Patient presents with  . Gynecologic Exam    HPI:   Patient is a 59 y.o. female  who presents today for gyn exam  She had CPE Jan 2021 Cervical Cancer Screening: used to see Dr Stefano Gaul, was doing paps every years, reports normal, did not have one done last year, colpo in 2015 CIN 1 Breast Cancer Screening: nov 2020, normal  Depression screen [facility] 2/9 01/27/2020 01/15/2020 01/09/2018  Decreased Interest 0 0 0  Down, Depressed, Hopeless - 0 0  PHQ - 2 Score 0 0 0    Fall Risk  01/27/2020 01/15/2020 01/09/2018  Falls in the past year? 0 0 No  Number falls in past yr: 0 0 -  Injury with Fall? 0 0 -  Follow up - Falls evaluation completed -     Allergies  Allergen Reactions  . Floxin [Ofloxacin] Rash    Prior to Admission medications   Medication Sig Start Date End Date Taking? Authorizing Provider  B Complex-C (SUPER B-COMPLEX + VITAMIN C PO) Take by mouth.   Yes [provider]  Biotin 100 MG/GM POWD Take by mouth.   Yes [provider]  Calcium Carb-Cholecalciferol (CALCIUM 500/D) 500-400 MG-UNIT CHEW Chew by mouth.   Yes [provider]  psyllium (METAMUCIL) 58.6 % powder Take 1 packet by mouth 3 (three) times daily.   Yes [provider]    Past Medical History:  Diagnosis Date  . Allergy     No past surgical history on file.  Social History   Tobacco Use  . Smoking status: Never Smoker  . Smokeless tobacco: Never Used  Substance Use Topics  . Alcohol use: No    Family History  Problem Relation Age of Onset  . Heart disease Mother   . Stroke Sister     Review of Systems  Genitourinary:       Neg breast lumps or nipple discharge Neg vaginal discharge, pelvic pain, dyspareunia, abnormal vaginal bleeding   Per hpi  OBJECTIVE:  Today's Vitals   01/27/20 1130  BP: 116/77  Pulse: 77  Temp: 97.9 F (36.6 C)  SpO2: 97%    Weight: 240 lb (108.9 kg)  Height: 5' 10.5" (1.791 m)   Body mass index is 33.95 kg/m.   Physical Exam Vitals and nursing note reviewed. Exam conducted with a chaperone present.  Constitutional:      Appearance: She is well-developed.  HENT:     Head: Normocephalic and atraumatic.  Eyes:     General: No scleral icterus.    Conjunctiva/sclera: Conjunctivae normal.     Pupils: Pupils are equal, round, and reactive to light.  Pulmonary:     Effort: Pulmonary effort is normal.  Chest:     Breasts:        Right: No mass, nipple discharge or skin change.        Left: No mass, nipple discharge or skin change.  Genitourinary:    Labia:        Right: No rash or lesion.        Left: No rash or lesion.      Vagina: No vaginal discharge, erythema or lesions.     Cervix: No cervical motion tenderness, discharge, friability or lesion.     Uterus: Not enlarged, not fixed and not tender.      Adnexa:        Right: No mass or  tenderness.         Left: No mass or tenderness.    Musculoskeletal:     Cervical back: Neck supple.  Lymphadenopathy:     Upper Body:     Right upper body: No supraclavicular, axillary or pectoral adenopathy.     Left upper body: No supraclavicular, axillary or pectoral adenopathy.  Skin:    General: Skin is warm and dry.  Neurological:     Mental Status: She is alert and oriented to person, place, and time.     No results found for this or any previous visit (from the past 24 hour(s)).  No results found.   ASSESSMENT and PLAN  1. Cervical cancer screening - Cytology - PAP  Return in about 1 year (around 01/26/2021).    Rutherford Guys, MD [facility] [place], [place] [address] Ph.  [phone] Fax [phone]

## 2020-11-26 LAB — HM MAMMOGRAPHY

## 2020-11-30 ENCOUNTER — Encounter: Payer: Self-pay | Admitting: *Deleted

## 2021-03-04 ENCOUNTER — Ambulatory Visit (HOSPITAL_COMMUNITY)
Admission: EM | Admit: 2021-03-04 | Discharge: 2021-03-04 | Disposition: A | Discharge status: Home / Self Care | Payer: 59 | Attending: Family Medicine | Admitting: Family Medicine

## 2021-03-04 ENCOUNTER — Encounter (HOSPITAL_COMMUNITY): Payer: Self-pay

## 2021-03-04 ENCOUNTER — Ambulatory Visit (INDEPENDENT_AMBULATORY_CARE_PROVIDER_SITE_OTHER): Payer: 59

## 2021-03-04 ENCOUNTER — Other Ambulatory Visit: Payer: Self-pay

## 2021-03-04 DIAGNOSIS — S8010XA Contusion of unspecified lower leg, initial encounter: Secondary | ICD-10-CM

## 2021-03-04 DIAGNOSIS — M25562 Pain in left knee: Secondary | ICD-10-CM

## 2021-03-04 MED ORDER — NAPROXEN 500 MG PO TABS: 500.0000 mg | ORAL_TABLET | Freq: Two times a day (BID) | ORAL | 0 refills | Status: DC | PRN

## 2021-03-04 NOTE — Discharge Instructions (Signed)
May take up to 1000 mg of Tylenol 4 times daily as needed

## 2021-03-04 NOTE — ED Provider Notes (Signed)
[facility]    CSN: [id number] Arrival date & time: 03/04/21  1611      History   Chief Complaint Chief Complaint  Patient presents with  . Fall    HPI [name] is a 60 y.o. female.   Patient here today with multiple areas of bruising, tenderness bilateral lower extremities and left knee pain medially with some swelling x1 week after falling in the shower.  She took ibuprofen once or twice which provided mild temporary relief but otherwise has not been trying anything over-the-counter.  Pain is worse when trying to sleep still having to put a pillow between her legs to cushion.  Denies numbness, tingling, discoloration of knee, inability to bear weight.      Past Medical History:  Diagnosis Date  . Allergy     Patient Active Problem List   Diagnosis Date Noted  . 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 Insomnia 12/30/2015  . Obesity 12/20/2012    History reviewed. No pertinent surgical history.  OB History    Gravida  0   Para  0   Term  0   Preterm  0   AB  0   Living  0     SAB  0   IAB  0   Ectopic  0   Multiple  0   Live Births               Home Medications    Prior to Admission medications   Medication Sig Start Date End Date Taking? Authorizing Provider  naproxen (NAPROSYN) 500 MG tablet Take 1 tablet (500 mg total) by mouth 2 (two) times daily as needed. 03/04/21  Yes Particia Nearing, PA-C  B Complex-C (SUPER B-COMPLEX + VITAMIN C PO) Take by mouth.    [provider]  Biotin 100 MG/GM POWD Take by mouth.    [provider]  Calcium Carb-Cholecalciferol (CALCIUM 500/D) 500-400 MG-UNIT CHEW Chew by mouth.    [provider]  psyllium (METAMUCIL) 58.6 % powder Take 1 packet by mouth 3 (three) times daily.    [provider]    Family History Family History  Problem Relation Age of Onset  . Heart disease Mother   . Stroke Sister     Social  History Social History   Tobacco Use  . Smoking status: Never Smoker  . Smokeless tobacco: Never Used  Substance Use Topics  . Alcohol use: No  . Drug use: No     Allergies   Floxin [ofloxacin]   Review of Systems Review of Systems Per HPI  Physical Exam Triage Vital Signs ED Triage Vitals  Enc Vitals Group     BP 03/04/21 1633 (!) 142/79     Pulse Rate 03/04/21 1633 85     Resp 03/04/21 1633 17     Temp 03/04/21 1633 98.3 F (36.8 C)     Temp Source 03/04/21 1633 Oral     SpO2 03/04/21 1633 100 %     Weight --      Height --      Head Circumference --      Peak Flow --      Pain Score 03/04/21 1632 6     Pain Loc --      Pain Edu? --      Excl. in GC? --    No data found.  Updated Vital Signs BP (!) 142/79 (BP Location: Right Arm)   Pulse 85  Temp 98.3 F (36.8 C) (Oral)   Resp 17   SpO2 100%   Visual Acuity Right Eye Distance:   Left Eye Distance:   Bilateral Distance:    Right Eye Near:   Left Eye Near:    Bilateral Near:     Physical Exam Vitals and nursing note reviewed.  Constitutional:      Appearance: Normal appearance. She is not ill-appearing.  HENT:     Head: Atraumatic.  Eyes:     Extraocular Movements: Extraocular movements intact.     Conjunctiva/sclera: Conjunctivae normal.  Cardiovascular:     Rate and Rhythm: Normal rate and regular rhythm.     Heart sounds: Normal heart sounds.  Pulmonary:     Effort: Pulmonary effort is normal.     Breath sounds: Normal breath sounds.  Musculoskeletal:        General: Swelling, tenderness and signs of injury present. Normal range of motion.     Cervical back: Normal range of motion and neck supple.     Comments: Trace edema anterior and medial left knee Multiple areas of bruising bilateral shins  Skin:    General: Skin is warm and dry.  Neurological:     Mental Status: She is alert and oriented to person, place, and time.     Sensory: No sensory deficit.     Motor: No weakness.      Gait: Gait normal.     Comments: Bilateral lower extremities neurovascularly intact  Psychiatric:        Mood and Affect: Mood normal.        Thought Content: Thought content normal.        Judgment: Judgment normal.     UC Treatments / Results  Labs (all labs ordered are listed, but only abnormal results are displayed) Labs Reviewed - No data to display  EKG   Radiology DG Knee Complete 4 Views Left  Result Date: 03/04/2021 CLINICAL DATA:  Left knee pain.  Fell 1 week ago. EXAM: LEFT KNEE - COMPLETE 4+ VIEW COMPARISON:  None. FINDINGS: The joint spaces are maintained. No acute fracture or osteochondral abnormality. Suspect small suprapatellar knee joint effusion. IMPRESSION: No acute bony findings. Suspect small suprapatellar knee joint effusion. Electronically Signed   By: Rudie Meyer M.D.   On: 03/04/2021 17:33    Procedures Procedures (including critical care time)  Medications Ordered in UC Medications - No data to display  Initial Impression / Assessment and Plan / UC Course  I have reviewed the triage vital signs and the nursing notes.  Pertinent labs & imaging results that were available during my care of the patient were reviewed by me and considered in my medical decision making (see chart for details).     X-ray left knee negative for bony injury, suspect contusion versus soft tissue strain.  Will try naproxen, compression sleeve, ice, rest and follow-up with sports medicine if not resolving.  Areas of bruising appear benign and discussed over-the-counter pain relievers and ice for these as well.  Final Clinical Impressions(s) / UC Diagnoses   Final diagnoses:  Acute pain of left knee  Contusion of multiple sites of lower extremity, unspecified laterality, initial encounter     Discharge Instructions     May take up to 1000 mg of Tylenol 4 times daily as needed    ED Prescriptions    Medication Sig Dispense Auth. Provider   naproxen (NAPROSYN) 500  MG tablet Take 1 tablet (500 mg total) by mouth  2 (two) times daily as needed. 30 tablet Particia Nearing, [place]     PDMP not reviewed this encounter.   Particia Nearing, [place] 03/04/21 1926

## 2021-03-04 NOTE — ED Triage Notes (Signed)
Pt presents with right leg bruising and left knee pain & bruising after falling out of her shower a week ago.

## 2021-05-04 ENCOUNTER — Encounter: Payer: Self-pay | Admitting: Family Medicine

## 2021-05-04 ENCOUNTER — Ambulatory Visit (INDEPENDENT_AMBULATORY_CARE_PROVIDER_SITE_OTHER): Payer: 59 | Admitting: Family Medicine

## 2021-05-04 ENCOUNTER — Other Ambulatory Visit: Payer: Self-pay

## 2021-05-04 VITALS — BP 138/76 | HR 78 | Temp 97.0°F | Ht 70.5 in | Wt 241.8 lb

## 2021-05-04 DIAGNOSIS — E7800 Pure hypercholesterolemia, unspecified: Secondary | ICD-10-CM | POA: Insufficient documentation

## 2021-05-04 DIAGNOSIS — G4700 Insomnia, unspecified: Secondary | ICD-10-CM | POA: Diagnosis not present

## 2021-05-04 DIAGNOSIS — Z833 Family history of diabetes mellitus: Secondary | ICD-10-CM | POA: Diagnosis not present

## 2021-05-04 DIAGNOSIS — M722 Plantar fascial fibromatosis: Secondary | ICD-10-CM | POA: Insufficient documentation

## 2021-05-04 DIAGNOSIS — E669 Obesity, unspecified: Secondary | ICD-10-CM

## 2021-05-04 DIAGNOSIS — R609 Edema, unspecified: Secondary | ICD-10-CM | POA: Insufficient documentation

## 2021-05-04 DIAGNOSIS — J3089 Other allergic rhinitis: Secondary | ICD-10-CM | POA: Diagnosis not present

## 2021-05-04 DIAGNOSIS — K219 Gastro-esophageal reflux disease without esophagitis: Secondary | ICD-10-CM | POA: Insufficient documentation

## 2021-05-04 NOTE — Progress Notes (Signed)
Subjective:    Patient ID: [name], female    DOB: 1961-11-11, 60 y.o.   MRN: [id number]  HPI Chief Complaint  Patient presents with  . new pt get established    New pt get established. Bp has been going up and down. Highest it been 150/80, lately its been 141/79   She is new to the practice and here to get establish care.  Previous medical care: [place]   States she has been concerned about possibly having diabetes or blood pressure issues. Has been wanting to get a new provider to get checked out.   Family history of diabetes in mother.   HL- is not on statin   Denies history of HTN or DM.   States she has issues sleeping and takes OTC medication to help her sleep. States she watches sleep while going to sleep in her bedroom. Recently stopped working which has thrown off her sleep cycle. Is not napping.   GERD- takes omeprazole as needed   Reports mild ankle swelling, after standing in one place its worse, but improves after sleeping or elevating legs.   States she has been having rhinorrhea and post nasal drainage due to allergies. Has been taking Benadryl at night.   Social history: Lives with her husband, is not currently working.  Diet: unhealthy  Excerise: none   Immunizations: Covid -received 2 vaccines. Does not have her card and will return for a booster  Health maintenance:  Mammogram: 11/26/2020 Colonoscopy: 2015 Last Gynecological Exam: 2020   Reviewed allergies, medications, past medical, surgical, family, and social history.    Review of Systems Pertinent positives and negatives in the history of present illness.     Objective:   Physical Exam BP 138/76   Pulse 78   Temp (!) 97 F (36.1 C)   Ht 5' 10.5" (1.791 m)   Wt 241 lb 12.8 oz (109.7 kg)   SpO2 99%   BMI 34.20 kg/m   Alert and in no distress. Cardiac exam shows a regular sinus rhythm without murmurs or gallops. Lungs are clear to auscultation. Extremities without edema,  bruising to her anterior right shin. Skin is warm and dry. Normal speech, mood and memory.       Assessment & Plan:  Obesity (BMI 30-39.9) - Plan: CBC with Differential/Platelet, Comprehensive metabolic panel, TSH, Hemoglobin A1c  Insomnia, unspecified type - Plan: CBC with Differential/Platelet, Comprehensive metabolic panel, TSH  Family history of diabetes mellitus in mother - Plan: Hemoglobin A1c  Environmental and seasonal allergies  Gastroesophageal reflux disease, unspecified whether esophagitis present  Pure hypercholesterolemia - Plan: Lipid panel  Dependent edema - Plan: Comprehensive metabolic panel, TSH  She is new to the practice and here to establish care.  She has several concerns today. Counseling on healthy diet and exercise for weight loss.  Discussed how obesity can increase her risks of certain chronic health conditions including hypertension, diabetes, etc. Counseling on good sleep hygiene including not watching TV in the bedroom.  Recommend she try improving her sleep hygiene and stop taking medication to help with sleep if this works. Recommend using a nondrowsy antihistamine and potentially nasal spray for allergy control.  No sign of infection. Continue with lifestyle modifications for GERD.  Take a PPI as needed. Counseling on increasing walking and physical activity to help with dependent edema.  Discussed low-sodium diet and elevating her legs when sitting as well. Encouraged low-fat diet and increasing physical activity.  Check lipid panel and follow-up

## 2021-05-05 LAB — CBC WITH DIFFERENTIAL/PLATELET
Basophils Absolute: 0.1 10*3/uL (ref 0.0–0.2)
Basos: 1 %
EOS (ABSOLUTE): 0.1 10*3/uL (ref 0.0–0.4)
Eos: 3 %
Hematocrit: 40.2 % (ref 34.0–46.6)
Hemoglobin: 13.3 g/dL (ref 11.1–15.9)
Immature Grans (Abs): 0 10*3/uL (ref 0.0–0.1)
Immature Granulocytes: 0 %
Lymphocytes Absolute: 2 10*3/uL (ref 0.7–3.1)
Lymphs: 41 %
MCH: 28.7 pg (ref 26.6–33.0)
MCHC: 33.1 g/dL (ref 31.5–35.7)
MCV: 87 fL (ref 79–97)
Monocytes Absolute: 0.4 10*3/uL (ref 0.1–0.9)
Monocytes: 9 %
Neutrophils Absolute: 2.2 10*3/uL (ref 1.4–7.0)
Neutrophils: 46 %
Platelets: 377 10*3/uL (ref 150–450)
RBC: 4.63 x10E6/uL (ref 3.77–5.28)
RDW: 13.4 % (ref 11.7–15.4)
WBC: 4.8 10*3/uL (ref 3.4–10.8)

## 2021-05-05 LAB — LIPID PANEL
Chol/HDL Ratio: 6 ratio — ABNORMAL HIGH (ref 0.0–4.4)
Cholesterol, Total: 233 mg/dL — ABNORMAL HIGH (ref 100–199)
HDL: 39 mg/dL — ABNORMAL LOW (ref >39)
LDL Chol Calc (NIH): 162 mg/dL — ABNORMAL HIGH (ref 0–99)
Triglycerides: 172 mg/dL — ABNORMAL HIGH (ref 0–149)
VLDL Cholesterol Cal: 32 mg/dL (ref 5–40)

## 2021-05-05 LAB — COMPREHENSIVE METABOLIC PANEL
ALT: 15 IU/L (ref 0–32)
AST: 14 IU/L (ref 0–40)
Albumin/Globulin Ratio: 1.2 (ref 1.2–2.2)
Albumin: 4 g/dL (ref 3.8–4.9)
Alkaline Phosphatase: 116 IU/L (ref 44–121)
BUN/Creatinine Ratio: 17 (ref 9–23)
BUN: 15 mg/dL (ref 6–24)
Bilirubin Total: 0.2 mg/dL (ref 0.0–1.2)
CO2: 26 mmol/L (ref 20–29)
Calcium: 9.6 mg/dL (ref 8.7–10.2)
Chloride: 101 mmol/L (ref 96–106)
Creatinine, Ser: 0.9 mg/dL (ref 0.57–1.00)
Globulin, Total: 3.4 g/dL (ref 1.5–4.5)
Glucose: 89 mg/dL (ref 65–99)
Potassium: 4.7 mmol/L (ref 3.5–5.2)
Sodium: 139 mmol/L (ref 134–144)
Total Protein: 7.4 g/dL (ref 6.0–8.5)
eGFR: 74 mL/min/{1.73_m2} (ref >59)

## 2021-05-05 LAB — HEMOGLOBIN A1C
Est. average glucose Bld gHb Est-mCnc: 114 mg/dL
Hgb A1c MFr Bld: 5.6 % (ref 4.8–5.6)

## 2021-05-05 LAB — TSH: TSH: 0.956 u[IU]/mL (ref 0.450–4.500)

## 2021-05-11 ENCOUNTER — Telehealth: Payer: Self-pay | Admitting: Family Medicine

## 2021-05-11 NOTE — Telephone Encounter (Signed)
Pt called she is ready to start cholesterol meds, please send rx to [facility]  Pt aware you are out of office

## 2021-05-12 ENCOUNTER — Ambulatory Visit (INDEPENDENT_AMBULATORY_CARE_PROVIDER_SITE_OTHER): Payer: 59

## 2021-05-12 ENCOUNTER — Other Ambulatory Visit: Payer: Self-pay

## 2021-05-12 DIAGNOSIS — Z23 Encounter for immunization: Secondary | ICD-10-CM | POA: Diagnosis not present

## 2021-05-17 ENCOUNTER — Other Ambulatory Visit: Payer: Self-pay | Admitting: Family Medicine

## 2021-05-17 DIAGNOSIS — E7800 Pure hypercholesterolemia, unspecified: Secondary | ICD-10-CM

## 2021-05-17 MED ORDER — ATORVASTATIN CALCIUM 10 MG PO TABS: 10.0000 mg | ORAL_TABLET | Freq: Every day | ORAL | 1 refills | Status: DC

## 2021-05-17 NOTE — Telephone Encounter (Signed)
Cholesterol medication sent. She will need fasting labs in 6 weeks. I ordered these.

## 2021-05-18 NOTE — Telephone Encounter (Signed)
Pt has an appt 8/3. We can recheck her lipids then so she don't have to schedule another additional appointment. Pt states since she has to be fasting for her cpe

## 2021-05-18 NOTE — Telephone Encounter (Signed)
Left message for pt to call me back 

## 2021-06-22 ENCOUNTER — Encounter: Payer: Self-pay | Admitting: Internal Medicine

## 2021-07-27 ENCOUNTER — Encounter: Payer: 59 | Admitting: Family Medicine

## 2021-08-04 ENCOUNTER — Other Ambulatory Visit: Payer: Self-pay

## 2021-08-04 ENCOUNTER — Other Ambulatory Visit: Payer: 59

## 2021-08-04 DIAGNOSIS — E7800 Pure hypercholesterolemia, unspecified: Secondary | ICD-10-CM

## 2021-08-04 LAB — ALT: ALT: 18 IU/L (ref 0–32)

## 2021-08-05 LAB — LIPID PANEL
Chol/HDL Ratio: 3.4 ratio (ref 0.0–4.4)
Cholesterol, Total: 158 mg/dL (ref 100–199)
HDL: 46 mg/dL (ref >39)
LDL Chol Calc (NIH): 99 mg/dL (ref 0–99)
Triglycerides: 63 mg/dL (ref 0–149)
VLDL Cholesterol Cal: 13 mg/dL (ref 5–40)

## 2021-08-23 ENCOUNTER — Ambulatory Visit (INDEPENDENT_AMBULATORY_CARE_PROVIDER_SITE_OTHER): Payer: 59 | Admitting: Family Medicine

## 2021-08-23 ENCOUNTER — Other Ambulatory Visit: Payer: Self-pay

## 2021-08-23 ENCOUNTER — Encounter: Payer: Self-pay | Admitting: Family Medicine

## 2021-08-23 VITALS — BP 142/90 | HR 70 | Temp 98.1°F | Wt 238.6 lb

## 2021-08-23 DIAGNOSIS — R030 Elevated blood-pressure reading, without diagnosis of hypertension: Secondary | ICD-10-CM | POA: Diagnosis not present

## 2021-08-23 DIAGNOSIS — J0140 Acute pansinusitis, unspecified: Secondary | ICD-10-CM

## 2021-08-23 DIAGNOSIS — R519 Headache, unspecified: Secondary | ICD-10-CM | POA: Diagnosis not present

## 2021-08-23 MED ORDER — AMOXICILLIN 875 MG PO TABS: 875.0000 mg | ORAL_TABLET | Freq: Two times a day (BID) | ORAL | 0 refills | Status: AC

## 2021-08-23 NOTE — Progress Notes (Signed)
   Subjective:    Patient ID: [name], female    DOB: 08-09-1961, 60 y.o.   MRN: [id number]  HPI Chief Complaint  Patient presents with   other    Head ache's and BP has been running high, did have vertigo twice with in a week. It has been running 140-150    Here with complaints of elevated BP at home. Denies history of HTN. Never been on medications.  Started checking BP at home this past week due to headache.  BP at home was in 140-150s/70-89.   She also complains of intermittent headache. Pain is "all over". Worse in the frontal area bilaterally. More severe than in the past. Also having nasal congestion, sinus pressure. Runny nose and post nasal drainage x 4 weeks.   States she took Tylenol and ibuprofen for headache which helped.   Negative for Covid at home.    2 episodes of "vertigo".  States she has had this in the past.   Denies fever, chills, chest pain, palpitations, shortness of breath, abdominal pain, N/V/D, urinary symptoms, LE edema.   Reviewed allergies, medications, past medical, surgical, family, and social history.    Review of Systems Pertinent positives and negatives in the history of present illness.     Objective:   Physical Exam BP (!) 142/90   Pulse 70   Temp 98.1 F (36.7 C)   Wt 238 lb 9.6 oz (108.2 kg)   BMI 33.75 kg/m   Alert and in no distress. Sinus TTP frontal and maxillary, worse on right. Nares with erythema, edema and thick mucus. Tympanic membranes and canals are normal. Pharyngeal area is normal. Neck is supple without adenopathy or thyromegaly. Cardiac exam shows a regular sinus rhythm without murmurs or gallops. Lungs are clear to auscultation. Extremities without edema.      Assessment & Plan:  Acute non-recurrent pansinusitis - Plan: amoxicillin (AMOXIL) 875 MG tablet  Frontal headache  Intermittent headache  Elevated blood pressure reading without diagnosis of hypertension  Suspect sinusitis and will treat for  same. Also discussed symptomatic management of headaches and to pay more attention to symptoms. Increase water intake. Continue to monitor BP at home. Advised certain medications can increase BP. Reduce sodium. Follow up in 4 weeks or sooner if needed.  Spent more than 30 minutes with patient and 100% of the time in coordination of care and treatment.

## 2021-08-23 NOTE — Patient Instructions (Addendum)
Take the antibiotic as prescribed.  Stay well-hydrated.  Try over-the-counter pain medication such as Tylenol or ibuprofen. Try over-the-counter nasal spray such as Flonase, Nasacort or Rhinocort.  You may try a nondrowsy antihistamine such as Claritin, Allegra, Xyzal. If you choose to try a multisymptom sinus medication then do not take an additional antihistamine.  You may take over the counter Mucinex for congestion and cough.   Check your blood pressure once you are feeling well and pay close attention to your diet, cutting back on sodium.  Keep a record of your blood pressure readings until I see you again for your CPE.  I will see you back sooner if needed.

## 2021-09-28 ENCOUNTER — Ambulatory Visit (INDEPENDENT_AMBULATORY_CARE_PROVIDER_SITE_OTHER): Payer: 59 | Admitting: Family Medicine

## 2021-09-28 ENCOUNTER — Encounter: Payer: Self-pay | Admitting: Family Medicine

## 2021-09-28 ENCOUNTER — Other Ambulatory Visit: Payer: Self-pay

## 2021-09-28 VITALS — BP 138/84 | HR 72 | Ht 69.0 in | Wt 242.8 lb

## 2021-09-28 DIAGNOSIS — E7800 Pure hypercholesterolemia, unspecified: Secondary | ICD-10-CM | POA: Diagnosis not present

## 2021-09-28 DIAGNOSIS — M25571 Pain in right ankle and joints of right foot: Secondary | ICD-10-CM

## 2021-09-28 DIAGNOSIS — Z0000 Encounter for general adult medical examination without abnormal findings: Secondary | ICD-10-CM | POA: Diagnosis not present

## 2021-09-28 DIAGNOSIS — G8929 Other chronic pain: Secondary | ICD-10-CM

## 2021-09-28 DIAGNOSIS — M25512 Pain in left shoulder: Secondary | ICD-10-CM

## 2021-09-28 DIAGNOSIS — J0140 Acute pansinusitis, unspecified: Secondary | ICD-10-CM | POA: Diagnosis not present

## 2021-09-28 DIAGNOSIS — E669 Obesity, unspecified: Secondary | ICD-10-CM

## 2021-09-28 DIAGNOSIS — J3089 Other allergic rhinitis: Secondary | ICD-10-CM | POA: Diagnosis not present

## 2021-09-28 DIAGNOSIS — R519 Headache, unspecified: Secondary | ICD-10-CM | POA: Diagnosis not present

## 2021-09-28 MED ORDER — AMOXICILLIN-POT CLAVULANATE 875-125 MG PO TABS: 1.0000 | ORAL_TABLET | Freq: Two times a day (BID) | ORAL | 0 refills | Status: DC

## 2021-09-28 NOTE — Patient Instructions (Addendum)
Take the antibiotic, Xyzal and continue using Flonase.  I recommend you take a probiotic as long as you are on the antibiotic.   You can take meclizine over the counter for vertigo spells. Stay well hydrated.   I recommend scheduling an eye exam and dental exam.   You will hear from [facility] to schedule.   Preventive Care 58-58 Years Old, Female Preventive care refers to lifestyle choices and visits with your health care provider that can promote health and wellness. This includes: A yearly physical exam. This is also called an annual wellness visit. Regular dental and eye exams. Immunizations. Screening for certain conditions. Healthy lifestyle choices, such as: Eating a healthy diet. Getting regular exercise. Not using drugs or products that contain nicotine and tobacco. Limiting alcohol use. What can I expect for my preventive care visit? Physical exam Your health care provider will check your: Height and weight. These may be used to calculate your BMI (body mass index). BMI is a measurement that tells if you are at a healthy weight. Heart rate and blood pressure. Body temperature. Skin for abnormal spots. Counseling Your health care provider may ask you questions about your: Past medical problems. Family's medical history. Alcohol, tobacco, and drug use. Emotional well-being. Home life and relationship well-being. Sexual activity. Diet, exercise, and sleep habits. Work and work Statistician. Access to firearms. Method of birth control. Menstrual cycle. Pregnancy history. What immunizations do I need? Vaccines are usually given at various ages, according to a schedule. Your health care provider will recommend vaccines for you based on your age, medical history, and lifestyle or other factors, such as travel or where you work. What tests do I need? Blood tests Lipid and cholesterol levels. These may be checked every 5 years, or more often if you are over 46 years  old. Hepatitis C test. Hepatitis B test. Screening Lung cancer screening. You may have this screening every year starting at age 78 if you have a 30-pack-year history of smoking and currently smoke or have quit within the past 15 years. Colorectal cancer screening. All adults should have this screening starting at age 69 and continuing until age 60. Your health care provider may recommend screening at age 40 if you are at increased risk. You will have tests every 1-10 years, depending on your results and the type of screening test. Diabetes screening. This is done by checking your blood sugar (glucose) after you have not eaten for a while (fasting). You may have this done every 1-3 years. Mammogram. This may be done every 1-2 years. Talk with your health care provider about when you should start having regular mammograms. This may depend on whether you have a family history of breast cancer. BRCA-related cancer screening. This may be done if you have a family history of breast, ovarian, tubal, or peritoneal cancers. Pelvic exam and Pap test. This may be done every 3 years starting at age 17. Starting at age 100, this may be done every 5 years if you have a Pap test in combination with an HPV test. Other tests STD (sexually transmitted disease) testing, if you are at risk. Bone density scan. This is done to screen for osteoporosis. You may have this scan if you are at high risk for osteoporosis. Talk with your health care provider about your test results, treatment options, and if necessary, the need for more tests. Follow these instructions at home: Eating and drinking  Eat a diet that includes fresh fruits and vegetables, whole  grains, lean protein, and low-fat dairy products. Take vitamin and mineral supplements as recommended by your health care provider. Do not drink alcohol if: Your health care provider tells you not to drink. You are pregnant, may be pregnant, or are planning to  become pregnant. If you drink alcohol: Limit how much you have to 0-1 drink a day. Be aware of how much alcohol is in your drink. In the [place] one drink equals one 12 oz bottle of beer (355 mL), one 5 oz glass of wine (148 mL), or one 1 oz glass of hard liquor (44 mL). Lifestyle Take daily care of your teeth and gums. Brush your teeth every morning and night with fluoride toothpaste. Floss one time each day. Stay active. Exercise for at least 30 minutes 5 or more days each week. Do not use any products that contain nicotine or tobacco, such as cigarettes, e-cigarettes, and chewing tobacco. If you need help quitting, ask your health care provider. Do not use drugs. If you are sexually active, practice safe sex. Use a condom or other form of protection to prevent STIs (sexually transmitted infections). If you do not wish to become pregnant, use a form of birth control. If you plan to become pregnant, see your health care provider for a prepregnancy visit. If told by your health care provider, take low-dose aspirin daily starting at age 30. Find healthy ways to cope with stress, such as: Meditation, yoga, or listening to music. Journaling. Talking to a trusted person. Spending time with friends and family. Safety Always wear your seat belt while driving or riding in a vehicle. Do not drive: If you have been drinking alcohol. Do not ride with someone who has been drinking. When you are tired or distracted. While texting. Wear a helmet and other protective equipment during sports activities. If you have firearms in your house, make sure you follow all gun safety procedures. What's next? Visit your health care provider once a year for an annual wellness visit. Ask your health care provider how often you should have your eyes and teeth checked. Stay up to date on all vaccines. This information is not intended to replace advice given to you by your health care provider. Make sure you discuss  any questions you have with your health care provider. Document Revised: 02/18/2021 Document Reviewed: 08/22/2018 Elsevier Patient Education  2022 [employer].

## 2021-09-28 NOTE — Progress Notes (Signed)
Subjective:    Patient ID: [name], female    DOB: Feb 21, 1961, 60 y.o.   MRN: [id number]  HPI Chief Complaint  Patient presents with   Annual Exam   Nasal Congestion    Cough, Drainage and head congestion on and off since August   She is here for a complete physical exam.  She is complaining of nasal congestion, rhinorrhea, ear fullness since August. States she completed an antibiotic in August and only felt slightly improved. She did not let me know. Feels like a sinus infection per patient.   Started using Flonase 3 days ago.   Left shoulder pain, chronic. No injury. Having issues sleeping on her left side.  She also has chronic right ankle pain limiting her ability to exercise and look for a new job. She is requesting referral to ortho.   HL- Lipitor 10 mg daily   Social history: Lives with husband, is not working Denies smoking, drinking alcohol, drug use  Diet: unhealthy  Excerise: nothing recently    Health maintenance:   Mammogram: 11/26/2020 Colonoscopy: 11/2014 Last Gynecological Exam:  2020 Last Dental Exam: years ago  Last Eye Exam: 2018   Wears seatbelt always, smoke detectors in home and functioning, does not text while driving and feels safe in home environment.   Reviewed allergies, medications, past medical, surgical, family, and social history.     Review of Systems Review of Systems Constitutional: -fever, -chills, -sweats, -unexpected weight change,-fatigue ENT: +runny nose, -ear pain, -sore throat Cardiology:  -chest pain, -palpitations, -edema Respiratory: -cough, -shortness of breath, -wheezing Gastroenterology: -abdominal pain, -nausea, -vomiting, -diarrhea, -constipation Hematology: -bleeding or bruising problems Musculoskeletal: -arthralgias, -myalgias, -joint swelling, -back pain Ophthalmology: -vision changes Urology: -dysuria, -difficulty urinating, -hematuria, -urinary frequency, -urgency Neurology: -headache, -weakness,  -tingling, -numbness       Objective:   Physical Exam BP 138/84 (BP Location: Right Arm, Patient Position: Sitting)   Pulse 72   Ht 5\' 9"  (1.753 m)   Wt 242 lb 12.8 oz (110.1 kg)   SpO2 98%   BMI 35.86 kg/m   General Appearance:    Alert, cooperative, no distress, appears stated age  Head:    Normocephalic, without obvious abnormality, atraumatic  Eyes:    PERRL, conjunctiva/corneas clear, EOM's intact   Ears:    Dull TM's, no bulging, normal external ear canals  Nose:   Frontal and maxillary sinus tenderness  Throat:   Mask on   Neck:   Supple, no lymphadenopathy;  thyroid:  no   enlargement/tenderness/nodules; JVD  Back:    Spine nontender, no curvature, ROM normal, no CVA     tenderness  Lungs:     Clear to auscultation bilaterally without wheezes, rales or     ronchi; respirations unlabored  Chest Wall:    No tenderness or deformity   Heart:    Regular rate and rhythm, S1 and S2 normal, no murmur, rub   or gallop  Breast Exam:    OB/GYN  Abdomen:     Soft, non-tender, nondistended, normoactive bowel sounds,    no masses, no hepatosplenomegaly  Genitalia:    OB/GYN     Extremities:   No clubbing, cyanosis or edema  Pulses:   2+ and symmetric all extremities  Skin:   Skin color, texture, turgor normal, no rashes or lesions  Lymph nodes:   Cervical, supraclavicular, and axillary nodes normal  Neurologic:   CNII-XII intact, normal strength, sensation and gait  Psych:   Normal mood, affect, hygiene and grooming.        Assessment & Plan:  Routine general medical examination at a health care facility -Preventive health care reviewed.  Counseling on healthy lifestyle including diet and exercise.  Recommend regular dental and eye exams.  Immunizations reviewed.  Discussed safety.  Acute non-recurrent pansinusitis - Plan: amoxicillin-clavulanate (AUGMENTIN) 875-125 MG tablet -Discussed symptomatic management.  She will take the Augmentin as well as Flonase, Xyzal,  Tylenol or ibuprofen and follow-up if not back to baseline when she completes the antibiotic.  She is aware that she did not follow-up last time and she was not significantly better  Frontal headache -May use Tylenol or ibuprofen.  Antibiotic prescribed for sinusitis  Pure hypercholesterolemia -LDL improved significantly on statin.  Continue statin therapy.  Environmental and seasonal allergies -Try Flonase and Xyzal.  Chronic left shoulder pain - Plan: Ambulatory referral to Orthopedic Surgery  Chronic pain of right ankle - Plan: Ambulatory referral to Orthopedic Surgery  Obesity (BMI 30-39.9) -Recommend healthy diet and increasing physical activity.

## 2021-10-03 ENCOUNTER — Encounter: Payer: 59 | Admitting: Family Medicine

## 2021-10-18 ENCOUNTER — Ambulatory Visit: Payer: 59 | Admitting: Orthopaedic Surgery

## 2021-11-22 ENCOUNTER — Telehealth: Payer: Self-pay | Admitting: Family Medicine

## 2021-11-22 DIAGNOSIS — E7800 Pure hypercholesterolemia, unspecified: Secondary | ICD-10-CM

## 2021-11-22 MED ORDER — ATORVASTATIN CALCIUM 10 MG PO TABS: 10.0000 mg | ORAL_TABLET | Freq: Every day | ORAL | 1 refills | Status: DC

## 2021-11-22 NOTE — Telephone Encounter (Signed)
Pt called and requested a 90 day supply of atorvastatin. Please send to [facility] [address]. Pt can be reached at [phone].

## 2021-11-22 NOTE — Telephone Encounter (Signed)
done

## 2022-04-26 ENCOUNTER — Encounter: Payer: Self-pay | Admitting: Physician Assistant

## 2022-05-03 ENCOUNTER — Encounter: Payer: Self-pay | Admitting: Physician Assistant

## 2022-05-28 ENCOUNTER — Other Ambulatory Visit: Payer: Self-pay | Admitting: Medical

## 2022-05-28 DIAGNOSIS — E7800 Pure hypercholesterolemia, unspecified: Secondary | ICD-10-CM

## 2022-06-20 ENCOUNTER — Telehealth: Payer: Self-pay | Admitting: Physician Assistant

## 2022-06-20 ENCOUNTER — Other Ambulatory Visit: Payer: Self-pay | Admitting: Physician Assistant

## 2022-06-20 DIAGNOSIS — E7800 Pure hypercholesterolemia, unspecified: Secondary | ICD-10-CM

## 2022-06-20 MED ORDER — ATORVASTATIN CALCIUM 10 MG PO TABS: 10.0000 mg | ORAL_TABLET | Freq: Every day | ORAL | 0 refills | Status: DC

## 2022-06-20 NOTE — Telephone Encounter (Signed)
Pt requesting her atorvastatin to be sent to new pharmacy. [facility] located here in [place] and she says she is currently out and needs it soon.

## 2022-06-23 ENCOUNTER — Encounter: Payer: Self-pay | Admitting: Internal Medicine

## 2022-07-27 IMAGING — DX DG KNEE COMPLETE 4+V*L*
5 series · 5 of 5 positions shown · non-contrast
Comparison: None.

CLINICAL DATA: Left knee pain.  Fell 1 week ago.

EXAM:
LEFT KNEE - COMPLETE 4+ VIEW

[knee ap]
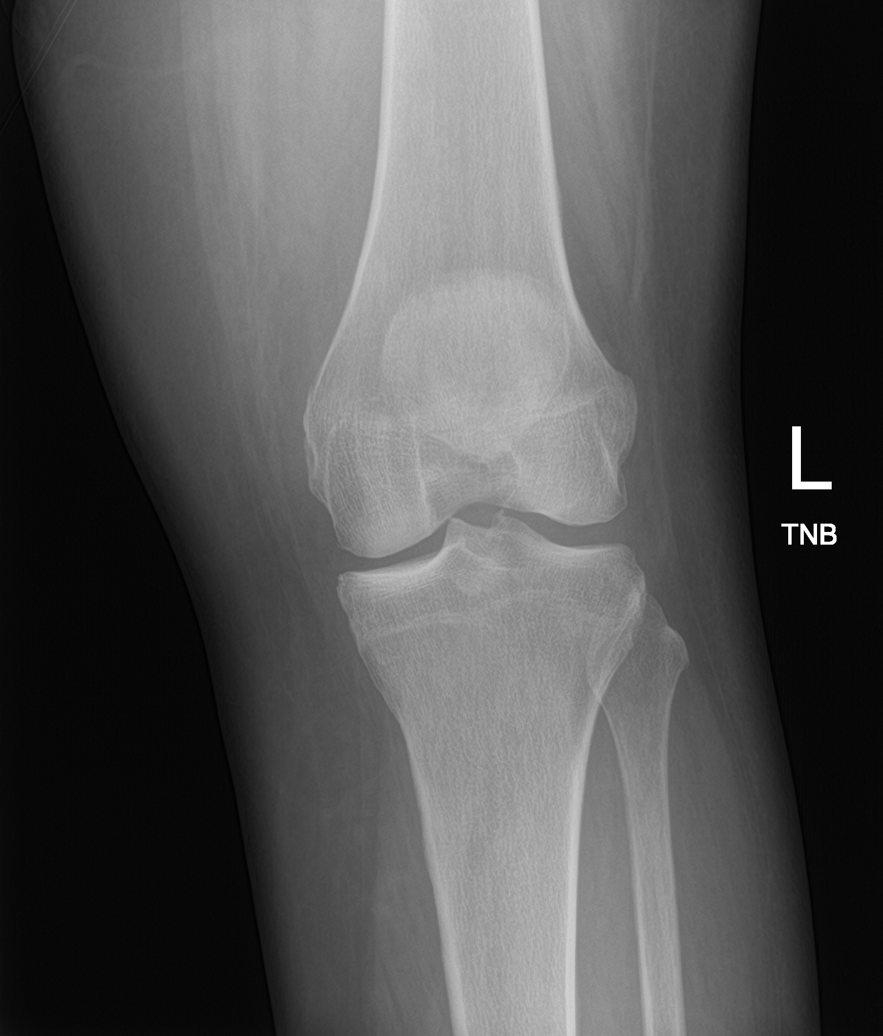

[knee obl (1 of 2)]
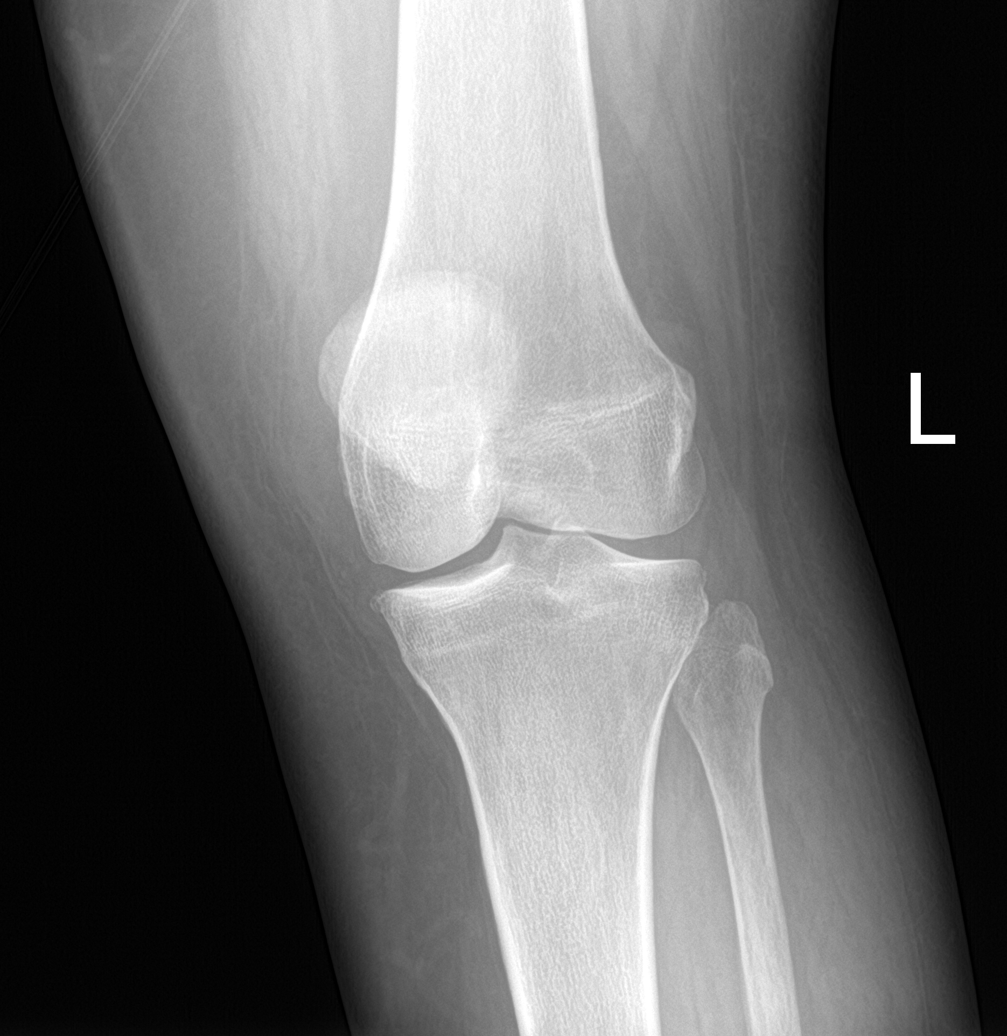

[knee obl (2 of 2)]
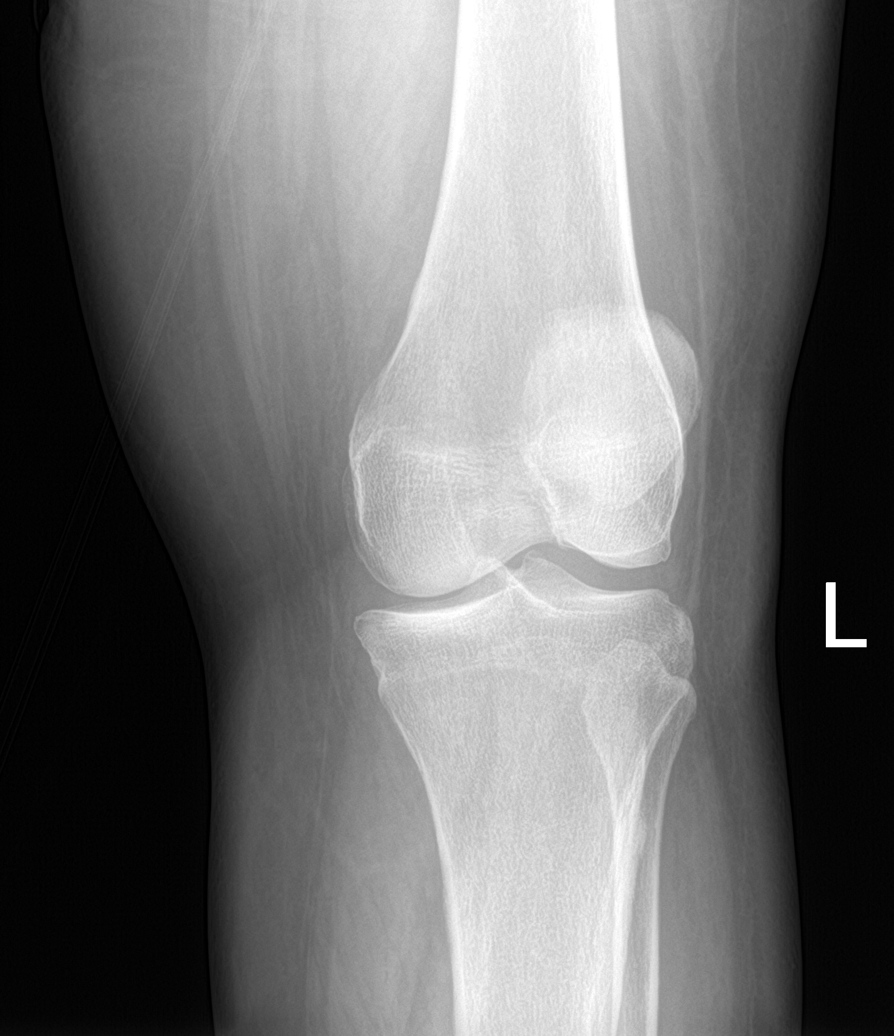

[knee lat]
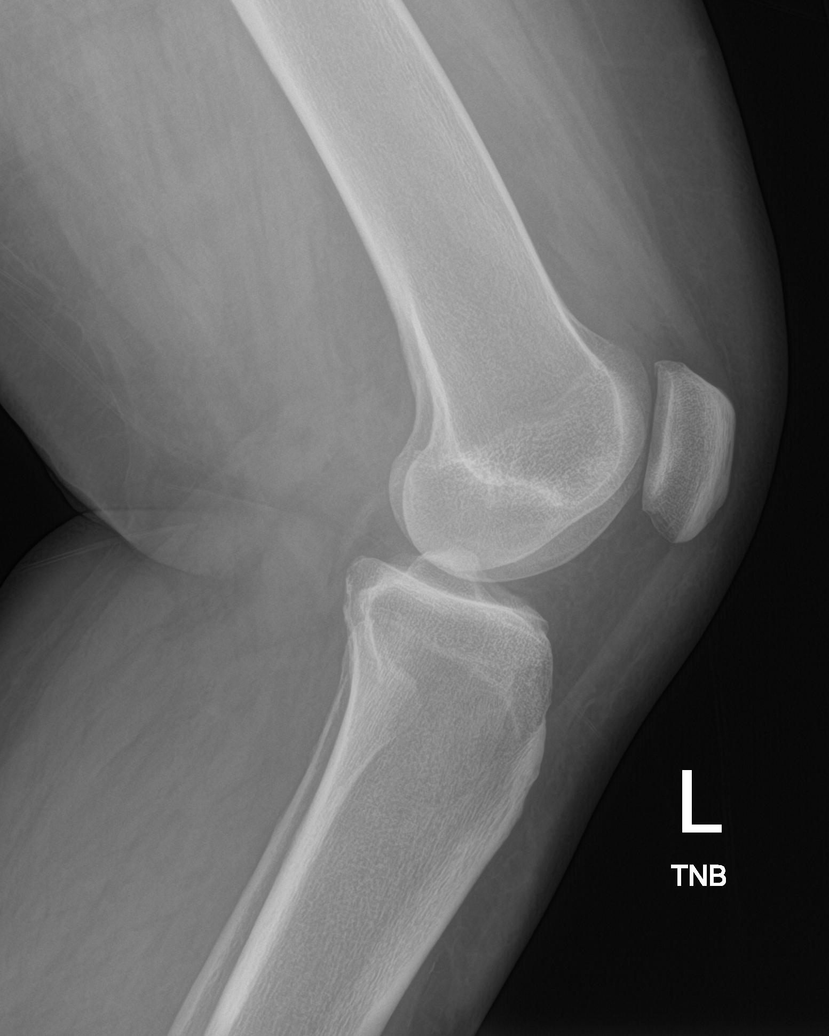

[knee sunrise]
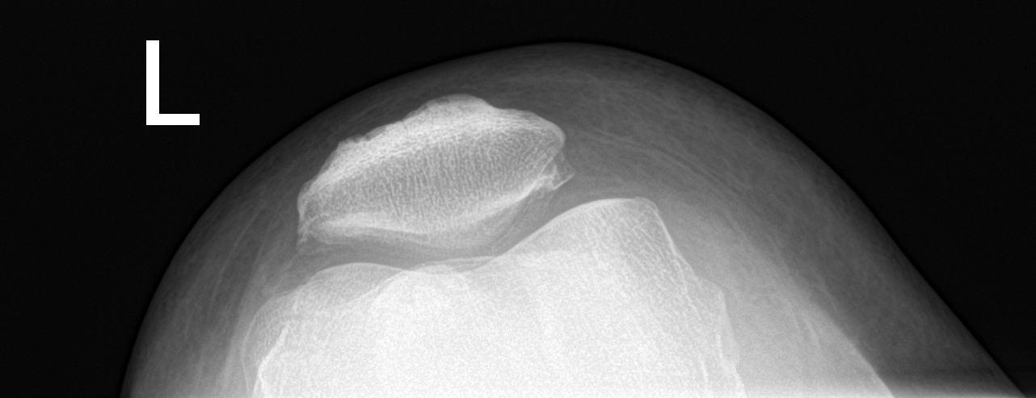

[5 of 5 positions shown; findings below may reference images not displayed]

FINDINGS: The joint spaces are maintained. No acute fracture or osteochondral
abnormality. Suspect small suprapatellar knee joint effusion.
IMPRESSION: No acute bony findings. Suspect small suprapatellar knee joint
effusion.

## 2022-08-30 ENCOUNTER — Other Ambulatory Visit: Payer: Self-pay | Admitting: Physician Assistant

## 2022-08-30 ENCOUNTER — Encounter: Payer: Self-pay | Admitting: Internal Medicine

## 2022-10-03 ENCOUNTER — Encounter: Payer: Self-pay | Admitting: Internal Medicine

## 2022-11-06 ENCOUNTER — Other Ambulatory Visit: Payer: Self-pay | Admitting: Medical

## 2022-11-07 ENCOUNTER — Encounter: Payer: Self-pay | Admitting: Internal Medicine

## 2023-03-01 ENCOUNTER — Telehealth: Payer: Self-pay | Admitting: Nurse Practitioner

## 2023-03-01 ENCOUNTER — Other Ambulatory Visit: Payer: Self-pay

## 2023-03-01 MED ORDER — ATORVASTATIN CALCIUM 10 MG PO TABS: 10.0000 mg | ORAL_TABLET | Freq: Every day | ORAL | 1 refills | Status: DC

## 2023-03-01 NOTE — Telephone Encounter (Signed)
Pt called and made a CPE appt with SaraBeth at her next available at the end of April. Please refill her lipitor until then. Pt uses [facility] and can be reached at [phone].

## 2023-03-14 LAB — HM MAMMOGRAPHY

## 2023-03-15 ENCOUNTER — Encounter: Payer: Self-pay | Admitting: Nurse Practitioner

## 2023-04-24 ENCOUNTER — Encounter: Payer: Self-pay | Admitting: Nurse Practitioner

## 2023-05-29 ENCOUNTER — Telehealth: Payer: Self-pay | Admitting: Nurse Practitioner

## 2023-05-29 ENCOUNTER — Other Ambulatory Visit: Payer: Self-pay

## 2023-05-29 MED ORDER — ATORVASTATIN CALCIUM 10 MG PO TABS: 10.0000 mg | ORAL_TABLET | Freq: Every day | ORAL | 2 refills | Status: AC

## 2023-05-29 NOTE — Telephone Encounter (Signed)
Pt called and states she had to get a new provider due to her insurance being out of network with [place] and she can't get in with her new provider until 8/20 but she needs a refill on her cholesterol medicine until then?

## 2024-10-10 NOTE — Progress Notes (Unsigned)
 Judith FORBES Doing, DNP, AGNP-c [facility] [address] [place], [place] [address] [phone]   ACUTE VISIT on 10/13/2024  Blood pressure 134/80, pulse 76, height 5' 9 (1.753 m), weight 230 lb 6.4 oz (104.5 kg).  Subjective:  HPI  History of Present Illness Shaasia Odle is a 63 year old female who presents today to re-establish care. She has concerns with recent vaginal spotting.   She has been experiencing vaginal spotting for the past two weeks, with episodes of BRB on minipad and occasional light spotting when wiping. The blood varies in color from red to dark. Her history of endometriosis and fibroids has been managed without surgical intervention. She believes the fibroids occasionally cause pain, which is manageable with Tylenol . Her last menstrual period was over ten years ago.  She experiences lower back pain, for which she had x-rays done previously, though she does not recall the results. She reports right lower abdominal pain. She has flat feet, causing significant discomfort, especially when not wearing a brace provided by an orthopedic doctor.  She has a history of acid reflux, which has worsened with age. She used to take over-the-counter omeprazole  and is considering restarting it due to frequent indigestion. She also experiences sinus issues, particularly at night, leading to a persistent cough and headaches.  Her husband passed away from cancer in 11-28-2024 of the previous year, and she has been experiencing anxiety and sleep disturbances since then. She was previously prescribed hydroxyzine for anxiety, which she has run out of.  She reports constipation and is considering restarting Metamucil, which she used in the past to regulate her bowel movements. She has not had a Pap smear in several years due to insurance changes and missed appointments, though her mammogram is up to date.   ROS negative except for what is listed in HPI. History,  Medications, Surgery, SDOH, and Family History reviewed and updated as appropriate.  Objective:  Physical Exam Vitals and nursing note reviewed.  Constitutional:      Appearance: Normal appearance.  HENT:     Head: Normocephalic.  Eyes:     Pupils: Pupils are equal, round, and reactive to light.  Cardiovascular:     Rate and Rhythm: Normal rate and regular rhythm.     Pulses: Normal pulses.     Heart sounds: Normal heart sounds.  Pulmonary:     Effort: Pulmonary effort is normal.     Breath sounds: Normal breath sounds.  Musculoskeletal:        General: Normal range of motion.     Cervical back: Normal range of motion.  Skin:    General: Skin is warm.  Neurological:     General: No focal deficit present.     Mental Status: She is alert and oriented to person, place, and time.  Psychiatric:        Mood and Affect: Mood normal.         Assessment & Plan:   Problem List Items Addressed This Visit     Insomnia   Insomnia and anxiety symptoms exacerbated by recent bereavement. Previous prescription of hydroxyzine was effective for sleep and anxiety management. - Prescribe hydroxyzine for insomnia and anxiety management.      Relevant Medications   hydrOXYzine (ATARAX) 10 MG tablet   Gastroesophageal reflux disease   Chronic GERD symptoms with recent exacerbation. Previous use of over-the-counter omeprazole  was effective. Symptoms include indigestion and nocturnal discomfort. - Prescribe omeprazole  for GERD management.  Relevant Medications   omeprazole  (PRILOSEC) 40 MG capsule   Post-menopausal bleeding - Primary   Intermittent spotting for two weeks with a background of uterine fibroids and endometriosis. Differential includes fibroid growth or endometrial changes. Occasional right-sided discomfort noted. Concern for potential endometrial thickening or fibroid enlargement. - Order pelvic ultrasound to assess fibroid size and endometrial lining. - Perform  laboratory tests to check hormone levels. - Schedule Pap smear if not up to date.      Relevant Orders   US  PELVIC COMPLETE WITH TRANSVAGINAL   CBC with Differential/Platelet   Comprehensive metabolic panel with GFR   Estradiol   FSH/LH   Progesterone   TSH   T4, free   Testosterone, Total, LC/MS/MS   POCT URINALYSIS DIP (CLINITEK) (Completed)   Pes planus of both feet   Chronic foot pain due to flat feet, exacerbated by lack of proper orthotic support. Current brace provides some relief but may not be adequate. Difficulty ambulating and desire to resume walking for exercise. - Refer to podiatrist for evaluation and potential orthotic or brace adjustment. - Recommend exploring orthotic shoes for better support.       Relevant Orders   Ambulatory referral to Podiatry   Intermittent constipation   Recent onset possibly related to fibroid growth affecting bowel function. Previous use of Metamucil was effective. - Recommend resuming Metamucil for bowel regulation.      Allergic rhinitis   Chronic allergic rhinitis with significant postnasal drip causing nocturnal cough. Previous use of Xyzal was effective in managing symptoms. - Prescribe Xyzal (levocetirizine) to be taken at bedtime for symptom control.      Grief   Grief response following the loss of her husband approximately 1 year ago.  No alarm symptoms are present at this time.  Recommend ongoing interaction with family and friends and avoid isolation.  Referral for counseling may be placed if she feels this would be helpful.      Environmental and seasonal allergies   Relevant Medications   levocetirizine (XYZAL) 5 MG tablet   Other Visit Diagnoses       Left foot pain       Relevant Orders   Ambulatory referral to Podiatry     Need for influenza vaccination       Relevant Orders   Flu vaccine trivalent PF, 6mos and older(Flulaval,Afluria,Fluarix,Fluzone) (Completed)      Judith FORBES Doing, DNP, AGNP-c Time: 63  minutes, >50% spent counseling, care coordination, chart review, and documentation.

## 2024-10-13 ENCOUNTER — Encounter: Payer: Self-pay | Admitting: Nurse Practitioner

## 2024-10-13 ENCOUNTER — Ambulatory Visit: Payer: Self-pay | Admitting: Nurse Practitioner

## 2024-10-13 VITALS — BP 134/80 | HR 76 | Ht 69.0 in | Wt 230.4 lb

## 2024-10-13 DIAGNOSIS — J309 Allergic rhinitis, unspecified: Secondary | ICD-10-CM

## 2024-10-13 DIAGNOSIS — J3089 Other allergic rhinitis: Secondary | ICD-10-CM

## 2024-10-13 DIAGNOSIS — M79672 Pain in left foot: Secondary | ICD-10-CM | POA: Diagnosis not present

## 2024-10-13 DIAGNOSIS — Z23 Encounter for immunization: Secondary | ICD-10-CM | POA: Diagnosis not present

## 2024-10-13 DIAGNOSIS — F4321 Adjustment disorder with depressed mood: Secondary | ICD-10-CM

## 2024-10-13 DIAGNOSIS — M2142 Flat foot [pes planus] (acquired), left foot: Secondary | ICD-10-CM | POA: Diagnosis not present

## 2024-10-13 DIAGNOSIS — K219 Gastro-esophageal reflux disease without esophagitis: Secondary | ICD-10-CM

## 2024-10-13 DIAGNOSIS — M2141 Flat foot [pes planus] (acquired), right foot: Secondary | ICD-10-CM | POA: Diagnosis not present

## 2024-10-13 DIAGNOSIS — F5101 Primary insomnia: Secondary | ICD-10-CM

## 2024-10-13 DIAGNOSIS — K5909 Other constipation: Secondary | ICD-10-CM | POA: Diagnosis not present

## 2024-10-13 DIAGNOSIS — N950 Postmenopausal bleeding: Secondary | ICD-10-CM | POA: Diagnosis not present

## 2024-10-13 LAB — POCT URINALYSIS DIP (CLINITEK)
Bilirubin, UA: NEGATIVE
Blood, UA: NEGATIVE — AB
Glucose, UA: NEGATIVE mg/dL
Ketones, POC UA: NEGATIVE mg/dL
Leukocytes, UA: NEGATIVE
Nitrite, UA: NEGATIVE
POC PROTEIN,UA: NEGATIVE
Spec Grav, UA: <=1.005 — AB (ref 1.010–1.025)
Urobilinogen, UA: 0.2 U/dL
pH, UA: 6.5 (ref 5.0–8.0)

## 2024-10-13 MED ORDER — OMEPRAZOLE 40 MG PO CPDR: 40.0000 mg | DELAYED_RELEASE_CAPSULE | Freq: Every day | ORAL | 3 refills | Status: AC

## 2024-10-13 MED ORDER — HYDROXYZINE HCL 10 MG PO TABS: 10.0000 mg | ORAL_TABLET | Freq: Four times a day (QID) | ORAL | 1 refills | Status: DC | PRN

## 2024-10-13 MED ORDER — HYDROXYZINE HCL 10 MG PO TABS: 10.0000 mg | ORAL_TABLET | Freq: Every evening | ORAL | 1 refills | Status: DC | PRN

## 2024-10-13 MED ORDER — LEVOCETIRIZINE DIHYDROCHLORIDE 5 MG PO TABS: 5.0000 mg | ORAL_TABLET | Freq: Every evening | ORAL | 1 refills | Status: AC

## 2024-10-13 NOTE — Assessment & Plan Note (Signed)
 Chronic foot pain due to flat feet, exacerbated by lack of proper orthotic support. Current brace provides some relief but may not be adequate. Difficulty ambulating and desire to resume walking for exercise. - Refer to podiatrist for evaluation and potential orthotic or brace adjustment. - Recommend exploring orthotic shoes for better support.

## 2024-10-13 NOTE — Assessment & Plan Note (Signed)
 Chronic GERD symptoms with recent exacerbation. Previous use of over-the-counter omeprazole  was effective. Symptoms include indigestion and nocturnal discomfort. - Prescribe omeprazole  for GERD management.

## 2024-10-13 NOTE — Patient Instructions (Signed)
 Managing Loss, Adult  People experience loss in many different ways throughout their lives. Events such as moving, changing jobs, and losing friends can create a sense of loss. The loss may be as serious as a major health change, divorce, death of a pet, or death of a loved one. All of these types of loss are likely to create a physical and emotional reaction known as grief. Grief is the result of a major change or an absence of something or someone that you count on. Grief is a normal reaction to loss.  A variety of factors can affect your grieving experience, including:  The nature of your loss.  Your relationship to what or whom you lost.  Your understanding of grief and how to manage it.  Your support system.  Be aware that when grief becomes extreme, it can lead to more severe issues like isolation, depression, anxiety, or suicidal thoughts. Talk with your health care provider if you have any of these issues.  How to manage lifestyle changes    Keep to your normal routine as much as possible.  If you have trouble focusing or doing normal activities, it is acceptable to take some time away from your normal routine.  Spend time with friends and loved ones.  Eat a healthy diet, get plenty of sleep, and rest when you feel tired.  How to recognize changes   The way that you deal with your grief will affect your ability to function as you normally do. When grieving, you may experience these changes:  Numbness, shock, sadness, anxiety, anger, denial, and guilt.  Thoughts about death.  Unexpected crying.  A physical sensation of emptiness in your stomach.  Problems sleeping and eating.  Tiredness (fatigue).  Loss of interest in normal activities.  Dreaming about or imagining seeing the person who died.  A need to remember what or whom you lost.  Difficulty thinking about anything other than your loss for a period of time.  Relief. If you have been expecting the loss for a while, you may feel a sense of relief when it  happens.  Follow these instructions at home:  Activity    Express your feelings in healthy ways, such as:  Talking with others about your loss. It may be helpful to find others who have had a similar loss, such as a support group.  Writing down your feelings in a journal.  Doing physical activities to release stress and emotional energy.  Doing creative activities like painting, sculpting, or playing or listening to music.  Practicing resilience. This is the ability to recover and adjust after facing challenges. Reading some resources that encourage resilience may help you to learn ways to practice those behaviors.     General instructions  Be patient with yourself and others. Allow the grieving process to happen, and remember that grieving takes time.  It is likely that you may never feel completely done with some grief. You may find a way to move on while still cherishing memories and feelings about your loss.  Accepting your loss is a process. It can take months or longer to adjust.  Keep all follow-up visits. This is important.  Where to find support  To get support for managing loss:  Ask your health care provider for help and recommendations, such as grief counseling or therapy.  Think about joining a support group for people who are managing a loss.  Where to find more information  You can find more information  about managing loss from:  American Society of Clinical Oncology: www.cancer.net  American Psychological Association: [employer]  Contact a health care provider if:  Your grief is extreme and keeps getting worse.  You have ongoing grief that does not improve.  Your body shows symptoms of grief, such as illness.  You feel depressed, anxious, or hopeless.  Get help right away if:  You have thoughts about hurting yourself or others.  Get help right away if you feel like you may hurt yourself or others, or have thoughts about taking your own life. Go to your nearest emergency room or:  Call 911.  Call the  National Suicide Prevention Lifeline at 985-787-5965 or 988. This is open 24 hours a day.  Text the Crisis Text Line at (308)798-1395.  Summary  Grief is the result of a major change or an absence of someone or something that you count on. Grief is a normal reaction to loss.  The depth of grief and the period of recovery depend on the type of loss and your ability to adjust to the change and process your feelings.  Processing grief requires patience and a willingness to accept your feelings and talk about your loss with people who are supportive.  It is important to find resources that work for you and to realize that people experience grief differently. There is not one grieving process that works for everyone in the same way.  Be aware that when grief becomes extreme, it can lead to more severe issues like isolation, depression, anxiety, or suicidal thoughts. Talk with your health care provider if you have any of these issues.  This information is not intended to replace advice given to you by your health care provider. Make sure you discuss any questions you have with your health care provider.  Document Revised: 08/01/2021 Document Reviewed: 08/01/2021  Elsevier Patient Education  2024 [employer].

## 2024-10-13 NOTE — Assessment & Plan Note (Signed)
 Intermittent spotting for two weeks with a background of uterine fibroids and endometriosis. Differential includes fibroid growth or endometrial changes. Occasional right-sided discomfort noted. Concern for potential endometrial thickening or fibroid enlargement. - Order pelvic ultrasound to assess fibroid size and endometrial lining. - Perform laboratory tests to check hormone levels. - Schedule Pap smear if not up to date.

## 2024-10-13 NOTE — Assessment & Plan Note (Signed)
 Recent onset possibly related to fibroid growth affecting bowel function. Previous use of Metamucil was effective. - Recommend resuming Metamucil for bowel regulation.

## 2024-10-13 NOTE — Assessment & Plan Note (Signed)
 Insomnia and anxiety symptoms exacerbated by recent bereavement. Previous prescription of hydroxyzine was effective for sleep and anxiety management. - Prescribe hydroxyzine for insomnia and anxiety management.

## 2024-10-13 NOTE — Assessment & Plan Note (Signed)
 Chronic allergic rhinitis with significant postnasal drip causing nocturnal cough. Previous use of Xyzal was effective in managing symptoms. - Prescribe Xyzal (levocetirizine) to be taken at bedtime for symptom control.

## 2024-10-13 NOTE — Assessment & Plan Note (Signed)
 Grief response following the loss of her husband approximately 1 year ago.  No alarm symptoms are present at this time.  Recommend ongoing interaction with family and friends and avoid isolation.  Referral for counseling may be placed if she feels this would be helpful.

## 2024-10-15 LAB — COMPREHENSIVE METABOLIC PANEL WITH GFR
ALT: 17 IU/L (ref 0–32)
AST: 20 IU/L (ref 0–40)
Albumin: 4.1 g/dL (ref 3.9–4.9)
Alkaline Phosphatase: 124 IU/L (ref 49–135)
BUN/Creatinine Ratio: 18 (ref 12–28)
BUN: 16 mg/dL (ref 8–27)
Bilirubin Total: 0.6 mg/dL (ref 0.0–1.2)
CO2: 24 mmol/L (ref 20–29)
Calcium: 9.9 mg/dL (ref 8.7–10.3)
Chloride: 102 mmol/L (ref 96–106)
Creatinine, Ser: 0.89 mg/dL (ref 0.57–1.00)
Globulin, Total: 3.1 g/dL (ref 1.5–4.5)
Glucose: 84 mg/dL (ref 70–99)
Potassium: 4 mmol/L (ref 3.5–5.2)
Sodium: 138 mmol/L (ref 134–144)
Total Protein: 7.2 g/dL (ref 6.0–8.5)
eGFR: 74 mL/min/1.73 (ref >59)

## 2024-10-15 LAB — CBC WITH DIFFERENTIAL/PLATELET
Basophils Absolute: 0.1 x10E3/uL (ref 0.0–0.2)
Basos: 1 %
EOS (ABSOLUTE): 0.1 x10E3/uL (ref 0.0–0.4)
Eos: 2 %
Hematocrit: 42.3 % (ref 34.0–46.6)
Hemoglobin: 13.9 g/dL (ref 11.1–15.9)
Immature Grans (Abs): 0 x10E3/uL (ref 0.0–0.1)
Immature Granulocytes: 0 %
Lymphocytes Absolute: 1.8 x10E3/uL (ref 0.7–3.1)
Lymphs: 37 %
MCH: 30 pg (ref 26.6–33.0)
MCHC: 32.9 g/dL (ref 31.5–35.7)
MCV: 91 fL (ref 79–97)
Monocytes Absolute: 0.3 x10E3/uL (ref 0.1–0.9)
Monocytes: 6 %
Neutrophils Absolute: 2.7 x10E3/uL (ref 1.4–7.0)
Neutrophils: 54 %
Platelets: 346 x10E3/uL (ref 150–450)
RBC: 4.63 x10E6/uL (ref 3.77–5.28)
RDW: 13 % (ref 11.7–15.4)
WBC: 5 x10E3/uL (ref 3.4–10.8)

## 2024-10-15 LAB — FSH/LH
FSH: 46.8 m[IU]/mL (ref 25.8–134.8)
LH: 42.7 m[IU]/mL (ref 7.7–58.5)

## 2024-10-15 LAB — ESTRADIOL: Estradiol: 33.4 pg/mL (ref 0.0–54.7)

## 2024-10-15 LAB — TESTOSTERONE, TOTAL, LC/MS/MS: Testosterone, total: 85.7 ng/dL — AB (ref 7.0–40.0)

## 2024-10-15 LAB — PROGESTERONE: Progesterone: 0.1 ng/mL

## 2024-10-15 LAB — TSH: TSH: 1.02 u[IU]/mL (ref 0.450–4.500)

## 2024-10-15 LAB — T4, FREE: Free T4: 1.27 ng/dL (ref 0.82–1.77)

## 2024-10-16 ENCOUNTER — Other Ambulatory Visit

## 2024-10-17 ENCOUNTER — Ambulatory Visit: Payer: Self-pay | Admitting: Nurse Practitioner

## 2024-10-17 ENCOUNTER — Ambulatory Visit (INDEPENDENT_AMBULATORY_CARE_PROVIDER_SITE_OTHER): Admitting: Podiatry

## 2024-10-17 ENCOUNTER — Encounter: Payer: Self-pay | Admitting: Podiatry

## 2024-10-17 ENCOUNTER — Ambulatory Visit

## 2024-10-17 VITALS — Ht 69.0 in | Wt 230.0 lb

## 2024-10-17 DIAGNOSIS — R7989 Other specified abnormal findings of blood chemistry: Secondary | ICD-10-CM

## 2024-10-17 DIAGNOSIS — M779 Enthesopathy, unspecified: Secondary | ICD-10-CM | POA: Diagnosis not present

## 2024-10-17 DIAGNOSIS — M2142 Flat foot [pes planus] (acquired), left foot: Secondary | ICD-10-CM

## 2024-10-17 DIAGNOSIS — M216X2 Other acquired deformities of left foot: Secondary | ICD-10-CM | POA: Diagnosis not present

## 2024-10-17 DIAGNOSIS — M2141 Flat foot [pes planus] (acquired), right foot: Secondary | ICD-10-CM

## 2024-10-17 DIAGNOSIS — N950 Postmenopausal bleeding: Secondary | ICD-10-CM

## 2024-10-17 DIAGNOSIS — M216X1 Other acquired deformities of right foot: Secondary | ICD-10-CM

## 2024-10-17 DIAGNOSIS — R9389 Abnormal findings on diagnostic imaging of other specified body structures: Secondary | ICD-10-CM

## 2024-10-17 NOTE — Progress Notes (Signed)
 Subjective:   Patient ID: [name], female   DOB: 63 y.o.   MRN: [id number]   HPI Patient presents with significant structural abnormalities of both feet that been there forever and with inflammation pain of the inside of the right ankle that has gotten worse over the last few months.  Patient does not currently smoke likes to be active moderate obesity noted   Review of Systems  All other systems reviewed and are negative.       Objective:  Physical Exam Vitals and nursing note reviewed.  Constitutional:      Appearance: She is well-developed.  Pulmonary:     Effort: Pulmonary effort is normal.  Musculoskeletal:        General: Normal range of motion.  Skin:    General: Skin is warm.  Neurological:     Mental Status: She is alert.     Neurovascular status was found to be intact muscle strength found to be adequate range of motion within normal limits.  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  At this point I went ahead and casted for functional orthotics with pedorthist and I placed on diclofenac twice daily and reappoint to recheck  X-rays indicate chronic foot deformity with chronic lowering of the arch structure bilateral with moderate indications of arthritis

## 2024-10-18 LAB — SPECIMEN STATUS REPORT

## 2024-10-18 LAB — PROLACTIN: Prolactin: 10.4 ng/mL (ref 3.6–25.2)

## 2024-10-18 LAB — DHEA-SULFATE: DHEA-SO4: 113 ug/dL (ref 29.4–220.5)

## 2024-10-20 ENCOUNTER — Other Ambulatory Visit: Payer: Self-pay

## 2024-10-20 ENCOUNTER — Telehealth: Payer: Self-pay | Admitting: Podiatry

## 2024-10-20 ENCOUNTER — Ambulatory Visit
Admission: RE | Admit: 2024-10-20 | Discharge: 2024-10-20 | Disposition: A | Discharge status: Home / Self Care | Source: Ambulatory Visit | Attending: Nurse Practitioner | Admitting: Nurse Practitioner

## 2024-10-20 DIAGNOSIS — D259 Leiomyoma of uterus, unspecified: Secondary | ICD-10-CM | POA: Diagnosis not present

## 2024-10-20 DIAGNOSIS — N950 Postmenopausal bleeding: Secondary | ICD-10-CM

## 2024-10-20 NOTE — Telephone Encounter (Signed)
 Patient states that a medication for inflammation was discussed at her visit. But the pharmacy hasn't received a script yet.

## 2024-10-31 NOTE — Telephone Encounter (Signed)
 Patient states the pharmacy didn't receive the script. Can you please resend . I confirmed the pharmacy and let her know that the order was placed on 10/20/24.

## 2024-11-26 ENCOUNTER — Other Ambulatory Visit: Payer: Self-pay | Admitting: Nurse Practitioner

## 2024-11-26 DIAGNOSIS — F5101 Primary insomnia: Secondary | ICD-10-CM

## 2024-11-26 NOTE — Telephone Encounter (Signed)
 Last appt. 10/20/24

## 2024-12-15 NOTE — Progress Notes (Unsigned)
 "  63 y.o. G0P0000 female here for new patient referral for Post menopausal bleeding, thickened uterine lining, elevated testosterone  levels. Married.  No LMP recorded. Patient is postmenopausal.   She reports rare spotting since October. Last occurred a few weeks ago. Intermittent RLQ pain, last felt this morning, 6/10, self resolving pain Worse during TVUS along right pelvis  Last mammogram: 03/14/23  10/20/24 TVUS: UTERUS: Uterus measures 9.7 x 4.8 x 5.5 cm. Uterine fibroid poorly visualized.   ENDOMETRIAL STRIPE: Endometrial measures  1cm. Thickened.   FREE FLUID: No free fluid.   IMPRESSION: 1. Thickened endometrium. In a postmenopausal patient underlying malignancy not excluded. Recommend gynecologic consultation. 2. Poorly visualized known uterine fibroids. 3. Bilateral ovaries are not visualized.  GYN HISTORY: Endometriosis, fibroids  OB History  Gravida Para Term Preterm AB Living  0 0 0 0 0 0  SAB IAB Ectopic Multiple Live Births  0 0 0 0    Past Medical History:  Diagnosis Date   Allergy    History reviewed. No pertinent surgical history. Medications Ordered Prior to Encounter[1] Allergies[2]    PE Today's Vitals   12/22/24 1050  Weight: 226 lb (102.5 kg)  Height: 5' 9.25 (1.759 m)   Body mass index is 33.13 kg/m.  Physical Exam Vitals reviewed. Exam conducted with a chaperone present.  Constitutional:      General: She is not in acute distress.    Appearance: Normal appearance.  HENT:     Head: Normocephalic and atraumatic.     Nose: Nose normal.  Eyes:     Extraocular Movements: Extraocular movements intact.     Conjunctiva/sclera: Conjunctivae normal.  Pulmonary:     Effort: Pulmonary effort is normal.  Genitourinary:    General: Normal vulva.     Exam position: Lithotomy position.     Vagina: Normal. No vaginal discharge.     Cervix: Normal. No cervical motion tenderness, discharge or lesion.     Uterus: Normal. Not enlarged and  not tender.      Adnexa: Right adnexa normal and left adnexa normal.  Musculoskeletal:        General: Normal range of motion.     Cervical back: Normal range of motion.  Neurological:     General: No focal deficit present.     Mental Status: She is alert.  Psychiatric:        Mood and Affect: Mood normal.        Behavior: Behavior normal.      Assessment and Plan:        Post-menopausal bleeding Assessment & Plan: Reviewed causes of PMB, including genitourinary syndrome of menopause, infection, trauma, polyps, urinary and GI etiologies, and malignancy.  Recommend endometrial assessment with transvaginal ultrasound or endometrial sampling- via EMB or diagnostic hysteroscopy. Reviewed that she may still require endometrial sampling if TVUS is abnormal. Patient elects for hysteroscopy, D&C, possible polypectomy.  Discussed outpatient procedure. Reviewed that  recovery is usually 1-2 days. Risks including infections, bleeding, and damage to surrounding organs reviewed. Recommend NPO prior to midnight and reviewed medication to take on day of surgery. Dicussed use of NSAIDS as needed for pain postoperatively.  Preop checklist: Antibiotics: none DVT ppx: SCDs Postop visit: 1 week Additional clearance: medical clearance     Orders: -     Ambulatory Referral For Surgery Scheduling  Thickened endometrium -     Ambulatory Referral For Surgery Scheduling  Cervical cancer screening -     Cytology - PAP  Elevated testosterone   level -     Testos,Total,Free and SHBG (Female)  Will recheck T levels, estradiol  and DHEA-s were normal   Vera LULLA Pa, MD       [1]  Current Outpatient Medications on File Prior to Visit  Medication Sig Dispense Refill   amLODipine (NORVASC) 5 MG tablet Take 5 mg by mouth daily.     atorvastatin  (LIPITOR) 10 MG tablet Take 1 tablet (10 mg total) by mouth daily. 30 tablet 2   Calcium -Magnesium-Vitamin D  185-50-100 MG-MG-UNIT CAPS Take 1 tablet by  mouth.     diclofenac (VOLTAREN) 75 MG EC tablet Take 75 mg by mouth 2 (two) times daily.     IBUPROFEN PO Take by mouth.     levocetirizine (XYZAL ) 5 MG tablet Take 1 tablet (5 mg total) by mouth every evening. 90 tablet 1   meloxicam  (MOBIC ) 15 MG tablet Take 15 mg by mouth daily.     Multiple Vitamins-Minerals (SENTRY SENIOR PO) Take by mouth.     omeprazole  (PRILOSEC) 40 MG capsule Take 1 capsule (40 mg total) by mouth daily. 90 capsule 3   No current facility-administered medications on file prior to visit.  [2]  Allergies Allergen Reactions   Floxin [Ofloxacin] Rash    Eye drops   "

## 2024-12-22 ENCOUNTER — Other Ambulatory Visit: Payer: Self-pay | Admitting: Family Medicine

## 2024-12-22 ENCOUNTER — Other Ambulatory Visit (HOSPITAL_COMMUNITY)
Admission: RE | Admit: 2024-12-22 | Discharge: 2024-12-22 | Disposition: A | Discharge status: Home / Self Care | Source: Ambulatory Visit | Attending: Obstetrics and Gynecology | Admitting: Obstetrics and Gynecology

## 2024-12-22 ENCOUNTER — Ambulatory Visit: Admitting: Obstetrics and Gynecology

## 2024-12-22 ENCOUNTER — Encounter: Payer: Self-pay | Admitting: Obstetrics and Gynecology

## 2024-12-22 VITALS — Ht 69.25 in | Wt 226.0 lb

## 2024-12-22 DIAGNOSIS — R7989 Other specified abnormal findings of blood chemistry: Secondary | ICD-10-CM

## 2024-12-22 DIAGNOSIS — Z124 Encounter for screening for malignant neoplasm of cervix: Secondary | ICD-10-CM | POA: Diagnosis present

## 2024-12-22 DIAGNOSIS — F5101 Primary insomnia: Secondary | ICD-10-CM

## 2024-12-22 DIAGNOSIS — N950 Postmenopausal bleeding: Secondary | ICD-10-CM | POA: Diagnosis not present

## 2024-12-22 DIAGNOSIS — R9389 Abnormal findings on diagnostic imaging of other specified body structures: Secondary | ICD-10-CM | POA: Diagnosis not present

## 2024-12-22 NOTE — Telephone Encounter (Signed)
 Last appt 09/2024

## 2024-12-24 ENCOUNTER — Ambulatory Visit: Payer: Self-pay | Admitting: Obstetrics and Gynecology

## 2024-12-24 DIAGNOSIS — R7989 Other specified abnormal findings of blood chemistry: Secondary | ICD-10-CM | POA: Insufficient documentation

## 2024-12-24 DIAGNOSIS — R9389 Abnormal findings on diagnostic imaging of other specified body structures: Secondary | ICD-10-CM | POA: Insufficient documentation

## 2024-12-24 DIAGNOSIS — N950 Postmenopausal bleeding: Secondary | ICD-10-CM | POA: Insufficient documentation

## 2024-12-24 LAB — CYTOLOGY - PAP
Comment: NEGATIVE
Diagnosis: NEGATIVE
High risk HPV: NEGATIVE

## 2024-12-24 NOTE — Assessment & Plan Note (Signed)
 Reviewed causes of PMB, including genitourinary syndrome of menopause, infection, trauma, polyps, urinary and GI etiologies, and malignancy.  Recommend endometrial assessment with transvaginal ultrasound or endometrial sampling- via EMB or diagnostic hysteroscopy. Reviewed that she may still require endometrial sampling if TVUS is abnormal. Patient elects for hysteroscopy, D&C, possible polypectomy.  Discussed outpatient procedure. Reviewed that  recovery is usually 1-2 days. Risks including infections, bleeding, and damage to surrounding organs reviewed. Recommend NPO prior to midnight and reviewed medication to take on day of surgery. Dicussed use of NSAIDS as needed for pain postoperatively.  Preop checklist: Antibiotics: none DVT ppx: SCDs Postop visit: 1 week Additional clearance: medical clearance

## 2024-12-25 LAB — TESTOS,TOTAL,FREE AND SHBG (FEMALE)
Free Testosterone: 14.2 pg/mL — ABNORMAL HIGH (ref 0.1–6.4)
Sex Hormone Binding: 27 nmol/L (ref 14–73)
Testosterone, Total, LC-MS-MS: 91 ng/dL — ABNORMAL HIGH (ref 2–45)

## 2024-12-29 ENCOUNTER — Ambulatory Visit: Payer: Self-pay | Admitting: Nurse Practitioner

## 2024-12-29 ENCOUNTER — Encounter: Payer: Self-pay | Admitting: Nurse Practitioner

## 2024-12-29 VITALS — BP 134/84 | HR 71 | Wt 227.4 lb

## 2024-12-29 DIAGNOSIS — R300 Dysuria: Secondary | ICD-10-CM | POA: Diagnosis not present

## 2024-12-29 DIAGNOSIS — G8929 Other chronic pain: Secondary | ICD-10-CM | POA: Diagnosis not present

## 2024-12-29 DIAGNOSIS — L659 Nonscarring hair loss, unspecified: Secondary | ICD-10-CM

## 2024-12-29 DIAGNOSIS — N950 Postmenopausal bleeding: Secondary | ICD-10-CM | POA: Diagnosis not present

## 2024-12-29 DIAGNOSIS — R7989 Other specified abnormal findings of blood chemistry: Secondary | ICD-10-CM

## 2024-12-29 DIAGNOSIS — M25512 Pain in left shoulder: Secondary | ICD-10-CM

## 2024-12-29 MED ORDER — GABAPENTIN 300 MG PO CAPS: 300.0000 mg | ORAL_CAPSULE | Freq: Every evening | ORAL | 1 refills | Status: AC | PRN

## 2024-12-29 NOTE — Assessment & Plan Note (Signed)
 Postmenopausal bleeding with thickened endometrial stripe and uterine fibroid on ultrasound. Differential includes malignancy, hence referral for diagnostic hysteroscopy, D&C, and polypectomy. Procedure is outpatient with general anesthesia. Biopsy of removed tissue will determine malignancy. Discussed potential need for oncology evaluation if malignancy is found. Pre-surgical exam and labs completed today with no concerning findings. Will review lab results and provide clearance, as long as results are normal, when paperwork received.  - Proceed with scheduled diagnostic hysteroscopy, D&C, and polypectomy. - Ensure surgical clearance is completed. - Ordered pre-surgical labs including complete blood count and iron level.  Orders:   Hemoglobin A1c   CBC with Differential/Platelet   Comprehensive metabolic panel with GFR

## 2024-12-29 NOTE — Patient Instructions (Signed)
 I have sent the referral to the dermatologist. If you labs come back as showing a possible cause, we can work to treat this in the meantime.   Try the gabapentin  before bedtime to see if this helps with the shoulder pain to allow you to rest. It is ok to continue tylenol  and meloxicam  with this medication.

## 2024-12-29 NOTE — Progress Notes (Unsigned)
 {SEHM (Optional):34217}  [name], DNP, AGNP-c [facility]  [address] [place], [place] [address] [phone]   ESTABLISHED PATIENT- Chronic Health and/or Follow-Up Visit on 12/29/2024  Blood pressure 134/84, pulse 71, weight 227 lb 6.4 oz (103.1 kg).   Subjective:  other (Returning for PAP, looks like she just had one 12/22/24. Questions about PAP results, )   History of Present Illness [name] is a 64 year old female who presents for follow-up with postmenopausal vaginal bleeding.  She has been experiencing intermittent spotting, sometimes coinciding with stress. The spotting is not heavy and has stopped at times, but recently occurred again on a Saturday night. She also experiences occasional cramping, described as a heaviness, but not as severe as menstrual cramps. She has a history of uterine fibroids, which have been monitored previously without intervention.  A Pap smear returned normal results. An ultrasound revealed a thickened endometrial stripe and the presence of uterine fibroids.  She has a recent history of high testosterone  levels, noted to be 97, with an unclear cause.  OB GYN has placed a referral for surgical evaluation for cause of uterine bleeding and fibroid/polyp removal as well and D&C. She will need medical clearance for this procedure. We have completed an exam and labs today for his purpose to help expedite the process.   She experiences shoulder pain, suspected to be related to the rotator cuff, which affects her sleep. She takes meloxicam  15 mg and Tylenol  BID for pain management but continues to experience significant discomfort, particularly at night. A previous cortisone shot did not provide relief. She sees orthopedics again on the 29th of this month. She has been doing the recommended exercises for her shoulder, but the pain persists.   She mentions hair thinning and a bald spot on her scalp, which she attributes to  stress or possibly other underlying conditions. She is considering seeing a dermatologist for further evaluation.  Family history is significant for fibroids, as her mother also had them.   Her sister recently passed away due to multiple health complications, including congestive heart failure and pulmonary edema. This comes about a year since the loss of her husband.   ROS negative except for what is listed in HPI. History, Medications, Surgery, SDOH, and Family History reviewed and updated as appropriate.  Objective:  Physical Exam Vitals and nursing note reviewed.  Constitutional:      General: She is not in acute distress.    Appearance: Normal appearance. She is obese.  Eyes:     General: No scleral icterus.    Pupils: Pupils are equal, round, and reactive to light.  Neck:     Vascular: No carotid bruit.  Cardiovascular:     Rate and Rhythm: Normal rate and regular rhythm.     Pulses: Normal pulses.     Heart sounds: Normal heart sounds.  Pulmonary:     Effort: Pulmonary effort is normal.     Breath sounds: Normal breath sounds.  Abdominal:     General: Bowel sounds are normal. There is no distension.     Palpations: Abdomen is soft.     Tenderness: There is no abdominal tenderness. There is no right CVA tenderness, left CVA tenderness or guarding.  Musculoskeletal:     Cervical back: No tenderness.     Right lower leg: No edema.     Left lower leg: No edema.     Comments: Left shoulder pain with external rotation and abduction.   Lymphadenopathy:  Cervical: No cervical adenopathy.  Skin:    General: Skin is warm and dry.     Capillary Refill: Capillary refill takes less than 2 seconds.  Neurological:     Mental Status: She is alert and oriented to person, place, and time.     Sensory: No sensory deficit.     Coordination: Coordination normal.  Psychiatric:        Mood and Affect: Mood normal.         Assessment & Plan:   Assessment & Plan Post-menopausal  bleeding Elevated testosterone  level Postmenopausal bleeding with thickened endometrial stripe and uterine fibroid on ultrasound. Differential includes malignancy, hence referral for diagnostic hysteroscopy, D&C, and polypectomy. Procedure is outpatient with general anesthesia. Biopsy of removed tissue will determine malignancy. Discussed potential need for oncology evaluation if malignancy is found. Pre-surgical exam and labs completed today with no concerning findings. Will review lab results and provide clearance, as long as results are normal, when paperwork received.  - Proceed with scheduled diagnostic hysteroscopy, D&C, and polypectomy. - Ensure surgical clearance is completed. - Ordered pre-surgical labs including complete blood count and iron level.  Orders:   Hemoglobin A1c   CBC with Differential/Platelet   Comprehensive metabolic panel with GFR  Chronic left shoulder pain Chronic left shoulder pain, possibly related to rotator cuff issues. Recent cortisone injection provided no immediate relief. Pain exacerbated at night, affecting sleep. Current management includes meloxicam  and Tylenol . Discussed potential use of gabapentin  for pain management and sleep aid. Advised against using a sling to prevent frozen shoulder. Recommend continue with daily exercise to help keep the shoulder moving.  - Prescribed gabapentin  for nighttime pain management and sleep aid. - Continue meloxicam  and Tylenol  as needed. - Follow up with orthopedic specialist on January 29th. Orders:   gabapentin  (NEURONTIN ) 300 MG capsule; Take 1 capsule (300 mg total) by mouth at bedtime as needed.  Dysuria Malodor and slight dysuria present. Will obtain UA today to rule out infection.  Orders:   POCT URINALYSIS DIP (CLINITEK)  Nonscarring hair loss Nonscarring hair loss with a bald spot on the top of the head. Possible causes include stress, hormonal changes, or nutritional deficiencies. Previous thyroid function  tests were normal. Discussed potential referral to dermatologist for further evaluation. - Ordered labs to check iron and B12 levels.Previous thyroid levels in October were normal.  - Will consider referral to dermatologist for further evaluation. Orders:   Iron, TIBC and Ferritin Panel   B12 and Folate Panel   VITAMIN D  25 Hydroxy (Vit-D Deficiency, Fractures)   Ambulatory referral to Dermatology    Camie FORBES Doing, DNP, AGNP-c  48 minutes spent on care provided today. Time includes care provided during the visit (>50% total time), care coordination, chart review, and documentation.

## 2024-12-30 LAB — IRON,TIBC AND FERRITIN PANEL
Ferritin: 238 ng/mL — ABNORMAL HIGH (ref 15–150)
Iron Saturation: 26 % (ref 15–55)
Iron: 71 ug/dL (ref 27–139)
Total Iron Binding Capacity: 270 ug/dL (ref 250–450)
UIBC: 199 ug/dL (ref 118–369)

## 2024-12-30 LAB — COMPREHENSIVE METABOLIC PANEL WITH GFR
ALT: 27 IU/L (ref 0–32)
AST: 20 IU/L (ref 0–40)
Albumin: 4.1 g/dL (ref 3.9–4.9)
Alkaline Phosphatase: 94 IU/L (ref 49–135)
BUN/Creatinine Ratio: 33 — ABNORMAL HIGH (ref 12–28)
BUN: 30 mg/dL — ABNORMAL HIGH (ref 8–27)
Bilirubin Total: 0.6 mg/dL (ref 0.0–1.2)
CO2: 24 mmol/L (ref 20–29)
Calcium: 9.5 mg/dL (ref 8.7–10.3)
Chloride: 100 mmol/L (ref 96–106)
Creatinine, Ser: 0.91 mg/dL (ref 0.57–1.00)
Globulin, Total: 3 g/dL (ref 1.5–4.5)
Glucose: 86 mg/dL (ref 70–99)
Potassium: 4 mmol/L (ref 3.5–5.2)
Sodium: 136 mmol/L (ref 134–144)
Total Protein: 7.1 g/dL (ref 6.0–8.5)
eGFR: 72 mL/min/1.73

## 2024-12-30 LAB — CBC WITH DIFFERENTIAL/PLATELET
Basophils Absolute: 0 x10E3/uL (ref 0.0–0.2)
Basos: 1 %
EOS (ABSOLUTE): 0 x10E3/uL (ref 0.0–0.4)
Eos: 0 %
Hematocrit: 44 % (ref 34.0–46.6)
Hemoglobin: 14.1 g/dL (ref 11.1–15.9)
Immature Grans (Abs): 0 x10E3/uL (ref 0.0–0.1)
Immature Granulocytes: 0 %
Lymphocytes Absolute: 1.7 x10E3/uL (ref 0.7–3.1)
Lymphs: 25 %
MCH: 29.6 pg (ref 26.6–33.0)
MCHC: 32 g/dL (ref 31.5–35.7)
MCV: 92 fL (ref 79–97)
Monocytes Absolute: 0.4 x10E3/uL (ref 0.1–0.9)
Monocytes: 5 %
Neutrophils Absolute: 4.6 x10E3/uL (ref 1.4–7.0)
Neutrophils: 69 %
Platelets: 345 x10E3/uL (ref 150–450)
RBC: 4.77 x10E6/uL (ref 3.77–5.28)
RDW: 13.4 % (ref 11.7–15.4)
WBC: 6.8 x10E3/uL (ref 3.4–10.8)

## 2024-12-30 LAB — HEMOGLOBIN A1C
Est. average glucose Bld gHb Est-mCnc: 120 mg/dL
Hgb A1c MFr Bld: 5.8 % — ABNORMAL HIGH (ref 4.8–5.6)

## 2024-12-30 LAB — VITAMIN D 25 HYDROXY (VIT D DEFICIENCY, FRACTURES): Vit D, 25-Hydroxy: 51.3 ng/mL (ref 30.0–100.0)

## 2024-12-31 ENCOUNTER — Encounter: Payer: Self-pay | Admitting: Dermatology

## 2024-12-31 ENCOUNTER — Encounter: Payer: Self-pay | Admitting: Nurse Practitioner

## 2024-12-31 ENCOUNTER — Ambulatory Visit: Payer: Self-pay | Admitting: Nurse Practitioner

## 2024-12-31 ENCOUNTER — Ambulatory Visit: Admitting: Dermatology

## 2024-12-31 DIAGNOSIS — L6681 Central centrifugal cicatricial alopecia: Secondary | ICD-10-CM

## 2024-12-31 DIAGNOSIS — L649 Androgenic alopecia, unspecified: Secondary | ICD-10-CM

## 2024-12-31 DIAGNOSIS — Z79899 Other long term (current) drug therapy: Secondary | ICD-10-CM

## 2024-12-31 MED ORDER — SAFETY SEAL MISCELLANEOUS MISC: 1.0000 | Freq: Every morning | 6 refills | Status: AC

## 2024-12-31 NOTE — Progress Notes (Signed)
" ° °  New Patient Visit   Subjective  [name] is a 64 y.o. female who presents for the following: Alopecia  Patient (and/or pt guardian) consented to the use of AI-assisted tools for note generation.  Located at the Scalp that she would like to have examined. Patient reports the areas have been there for almost two years She reports the areas are not tender but sometimes itch She states that the areas have spread.  Patient reports she has not previously been treated for these areas.  Patient reports she has not tried any OTC medications for hairloss Patient reports she uses Nizoral shampoo and Baskin and Lather shampoo Patient reports that in the past she used relaxers often Patient reports that she also use to wear tighter hair styles Patient states she sometimes uses rosemary oil and tea tree oil  The following portions of the chart were reviewed this encounter and updated as appropriate: medications, allergies, medical history  Review of Systems:  No other skin or systemic complaints except as noted in HPI or Assessment and Plan.  Objective  Well appearing patient in no apparent distress; mood and affect are within normal limits.  A focused examination was performed of the following areas: Scalp  Relevant exam findings are noted in the Assessment and Plan.              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  In women androgenetic alopecia is commonly associated with menopause but may occur any time after puberty.  It causes hair thinning primarily on the crown with widening of the part and temporal hairline recession.  Can use OTC Rogaine (minoxidil) 5% solution/foam as directed.    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 Central centrifugal cicatricial alopecia (CCCA) is characterized by scarring and inflammation, leading to permanent hair loss in affected areas. Androgenic alopecia is contributing to hair thinning, particularly post-menopause. The condition is genetic but exacerbated by chemical relaxers, tight hairstyles, and heat. Smooth areas indicate scarring, with no regrowth expected. However, areas with non-scarring follicles may see some regrowth with treatment.  - Prescribed compounded topical solution containing clobetasol, minoxidil, and finasteride to be applied every morning. - Advised against tight hairstyles and recommended removing hair from tight styles within 24 hours. - Recommended collagen supplements, preferably in gummy form if not consuming coffee or tea. - Scheduled follow-up appointment in May to assess progress and take pictures for comparison.    Long term medication management.  Patient is using long term (months to years) prescription medication  to control their dermatologic condition.  These medications require periodic monitoring to evaluate for efficacy and side effects and may require periodic laboratory monitoring.   ANDROGENIC ALOPECIA    Return for Alopecia & Hyperpigmentation of face F/U.  LILLETTE Lyle Cords, am acting as a neurosurgeon for [employer], DO .   Documentation: I have reviewed the above documentation for accuracy and completeness, and I agree with the above.  Delon Lenis, DO     "

## 2024-12-31 NOTE — Patient Instructions (Addendum)
 VISIT SUMMARY:  During your visit, we discussed your hair thinning and loss at the front hairline and back of your scalp, which has been ongoing for about a year. We reviewed your history, including the use of chemical relaxers and tight hairstyles, and the impact of menopause on your hair condition.  YOUR PLAN:  -CENTRAL CENTRIFUGAL CICATRICIAL ALOPECIA (CCCA):  CCCA is a condition characterized by scarring and inflammation that leads to permanent hair loss in the affected areas. We have prescribed a compounded topical solution containing clobetasol, minoxidil, and finasteride to be applied every morning. Avoid tight hairstyles and remove hair from tight styles within 24 hours. Consider taking collagen supplements, preferably in gummy form if you do not consume coffee or tea.  -ANDROGENIC ALOPECIA:  Androgenic alopecia is a genetic condition that causes hair thinning, particularly after menopause. The prescribed topical solution will also help manage this condition. Follow the same advice regarding hairstyles and collagen supplements.  INSTRUCTIONS:  Please apply the prescribed topical solution every morning as directed. Avoid tight hairstyles and remove hair from tight styles within 24 hours. Consider taking collagen supplements, preferably in gummy form if you do not consume coffee or tea. We have scheduled a follow-up appointment in May to assess your progress and take pictures for comparison.      Important Information  Due to recent changes in healthcare laws, you may see results of your pathology and/or laboratory studies on MyChart before the doctors have had a chance to review them. We understand that in some cases there may be results that are confusing or concerning to you. Please understand that not all results are received at the same time and often the doctors may need to interpret multiple results in order to provide you with the best plan of care or course of treatment.  Therefore, we ask that you please give us  2 business days to thoroughly review all your results before contacting the office for clarification. Should we see a critical lab result, you will be contacted sooner.   If You Need Anything After Your Visit  If you have any questions or concerns for your doctor, please call our main line at [phone] If no one answers, please leave a voicemail as directed and we will return your call as soon as possible. Messages left after 4 pm will be answered the following business day.   You may also send us  a message via MyChart. We typically respond to MyChart messages within 1-2 business days.  For prescription refills, please ask your pharmacy to contact our office. Our fax number is [phone].  If you have an urgent issue when the clinic is closed that cannot wait until the next business day, you can page your doctor at the number below.    Please note that while we do our best to be available for urgent issues outside of office hours, we are not available 24/7.   If you have an urgent issue and are unable to reach us , you may choose to seek medical care at your doctor's office, retail clinic, urgent care center, or emergency room.  If you have a medical emergency, please immediately call 911 or go to the emergency department. In the event of inclement weather, please call our main line at [phone] for an update on the status of any delays or closures.  Dermatology Medication Tips: Please keep the boxes that topical medications come in in order to help keep track of the instructions about where and how to  use these. Pharmacies typically print the medication instructions only on the boxes and not directly on the medication tubes.   If your medication is too expensive, please contact our office at [phone] or send us  a message through MyChart.   We are unable to tell what your co-pay for medications will be in advance as this is different  depending on your insurance coverage. However, we may be able to find a substitute medication at lower cost or fill out paperwork to get insurance to cover a needed medication.   If a prior authorization is required to get your medication covered by your insurance company, please allow us  1-2 business days to complete this process.  Drug prices often vary depending on where the prescription is filled and some pharmacies may offer cheaper prices.  The website www.goodrx.com contains coupons for medications through different pharmacies. The prices here do not account for what the cost may be with help from insurance (it may be cheaper with your insurance), but the website can give you the price if you did not use any insurance.  - You can print the associated coupon and take it with your prescription to the pharmacy.  - You may also stop by our office during regular business hours and pick up a GoodRx coupon card.  - If you need your prescription sent electronically to a different pharmacy, notify our office through [facility] or by phone at [phone]

## 2025-01-02 ENCOUNTER — Encounter: Payer: Self-pay | Admitting: *Deleted

## 2025-01-05 ENCOUNTER — Encounter: Payer: Self-pay | Admitting: Emergency Medicine

## 2025-01-05 ENCOUNTER — Ambulatory Visit
Admission: EM | Admit: 2025-01-05 | Discharge: 2025-01-05 | Disposition: A | Discharge status: Home / Self Care | Attending: Nurse Practitioner | Admitting: Nurse Practitioner

## 2025-01-05 DIAGNOSIS — R220 Localized swelling, mass and lump, head: Secondary | ICD-10-CM

## 2025-01-05 DIAGNOSIS — K0889 Other specified disorders of teeth and supporting structures: Secondary | ICD-10-CM

## 2025-01-05 DIAGNOSIS — K047 Periapical abscess without sinus: Secondary | ICD-10-CM | POA: Diagnosis not present

## 2025-01-05 MED ORDER — OXYCODONE HCL 5 MG PO TABS: 5.0000 mg | ORAL_TABLET | ORAL | 0 refills | Status: DC | PRN

## 2025-01-05 MED ORDER — LIDOCAINE VISCOUS HCL 2 % MT SOLN
15.0000 mL | Freq: Once | OROMUCOSAL | Status: AC
  Administered 2025-01-05: 15 mL via OROMUCOSAL

## 2025-01-05 MED ORDER — AMOXICILLIN-POT CLAVULANATE 875-125 MG PO TABS: 1.0000 | ORAL_TABLET | Freq: Two times a day (BID) | ORAL | 0 refills | Status: DC

## 2025-01-05 MED ORDER — IBUPROFEN 800 MG PO TABS: 800.0000 mg | ORAL_TABLET | Freq: Three times a day (TID) | ORAL | 0 refills | Status: DC | PRN

## 2025-01-05 MED ORDER — CHLORHEXIDINE GLUCONATE 0.12 % MT SOLN: 15.0000 mL | OROMUCOSAL | 0 refills | Status: AC

## 2025-01-05 MED ORDER — KETOROLAC TROMETHAMINE 60 MG/2ML IM SOLN
60.0000 mg | Freq: Once | INTRAMUSCULAR | Status: AC
  Administered 2025-01-05: 60 mg via INTRAMUSCULAR

## 2025-01-05 MED ORDER — CEFTRIAXONE SODIUM 1 G IJ SOLR
1.0000 g | Freq: Once | INTRAMUSCULAR | Status: AC
  Administered 2025-01-05: 1 g via INTRAMUSCULAR

## 2025-01-05 MED ORDER — LIDOCAINE VISCOUS HCL 2 % MT SOLN: OROMUCOSAL | 0 refills | Status: DC

## 2025-01-05 NOTE — ED Provider Notes (Signed)
 " [facility]    CSN: [id number] Arrival date & time: 01/05/25  1301      History   Chief Complaint Chief Complaint  Patient presents with   Abscess    HPI [name] is a 64 y.o. female.   Discussed the use of AI scribe software for clinical note transcription with the patient, who gave verbal consent to proceed.   [name] presents with mouth pain and right-sided facial swelling that has been present for approximately one week. She reports concern for a possible dental abscess, noting swelling of the lower right face that began about five days ago and has persisted. The swelling involves the gums in the area of a missing tooth, where she has noticed a small knot-like area on the gum surface. She describes the gums as very red, tender, and irritated.  Associated symptoms include a headache affecting the entire right side of her head, throat soreness on the affected side related to swelling, and significant pain interfering with sleep. She reports feeling warm but has not measured her temperature. The pain is severe and wakes her during the night, prompting her to sit upright and rock in bed in an attempt to find relief. She is unable to sleep on her right side and has removed her partial denture due to increased pain with use.  She has attempted pain relief with Tylenol  and gabapentin  for two nights, which was prescribed by her orthopedic provider to aid sleep, without improvement. She reports a delay in seeking care due to recent illness and the death of her sister, whose funeral occurred two days ago.  The following sections of the patient's history were reviewed and updated as appropriate: allergies, current medications, past family history, past medical history, past social history, past surgical history, and problem list.      Past Medical History:  Diagnosis Date   Allergy     Patient Active Problem List   Diagnosis Date Noted   Thickened  endometrium 12/24/2024   Elevated testosterone  level 12/24/2024   Post-menopausal bleeding 10/13/2024   Pes planus of both feet 10/13/2024   Intermittent constipation 10/13/2024   Allergic rhinitis 10/13/2024   Grief 10/13/2024   Plantar fascia syndrome 05/04/2021   Pure hypercholesterolemia 05/04/2021   Dependent edema 05/04/2021   Environmental and seasonal allergies 05/04/2021   Gastroesophageal reflux disease 05/04/2021   Family history of diabetes mellitus in mother 05/04/2021   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 No pertinent surgical history.  OB History     Gravida  0   Para  0   Term  0   Preterm  0   AB  0   Living  0      SAB  0   IAB  0   Ectopic  0   Multiple  0   Live Births               Home Medications    Prior to Admission medications  Medication Sig Start Date End Date Taking? Authorizing Provider  amoxicillin -clavulanate (AUGMENTIN ) 875-125 MG tablet Take 1 tablet by mouth 2 (two) times daily after a meal. 01/05/25  Yes Iola Lukes, FNP  chlorhexidine  (PERIDEX ) 0.12 % solution Use as directed 15 mLs in the mouth or throat 2 (two) times daily at 8 am and 6 pm for 7 days. Brush teeth then swish in mouth for 2 minutes  then spit out. Do not rinse mouth after use. Avoid eating, drinking, smoking and vaping for at least 30 minutes after use 01/05/25 01/12/25 Yes Jaze Rodino, FNP  ibuprofen  (ADVIL ) 800 MG tablet Take 1 tablet (800 mg total) by mouth every 8 (eight) hours as needed (pain). Take with food to avoid stomach upset. Do not take any additional NSAIDs while on this. You may take tylenol  in addition to this if needed for extra pain relief. 01/05/25  Yes Iola Lukes, FNP  lidocaine  (XYLOCAINE ) 2 % solution Swish with 15 mL every hour as needed for mouth pain. Spit out - do not swallow. Avoid eating or drinking for at least 15  minutes after use. 01/05/25  Yes Iola Lukes, FNP  oxyCODONE  (ROXICODONE ) 5 MG immediate release tablet Take 1 tablet (5 mg total) by mouth every 4 (four) hours as needed for severe pain (pain score 7-10). 01/05/25  Yes Iola Lukes, FNP  amLODipine (NORVASC) 5 MG tablet Take 5 mg by mouth daily. 07/21/24   [provider]  atorvastatin  (LIPITOR) 10 MG tablet Take 1 tablet (10 mg total) by mouth daily. 05/29/23   Early, Sara E, NP  Calcium -Magnesium-Vitamin D  185-50-100 MG-MG-UNIT CAPS Take 1 tablet by mouth.    [provider]  diclofenac (VOLTAREN) 75 MG EC tablet Take 75 mg by mouth 2 (two) times daily.    [provider]  gabapentin  (NEURONTIN ) 300 MG capsule Take 1 capsule (300 mg total) by mouth at bedtime as needed. 12/29/24   Early, Sara E, NP  hydrOXYzine  (ATARAX ) 10 MG tablet TAKE 1 TABLET BY MOUTH EVERY 6 HOURS AS NEEDED FOR ANXIETY 12/22/24   Early, Sara E, NP  levocetirizine (XYZAL ) 5 MG tablet Take 1 tablet (5 mg total) by mouth every evening. 10/13/24   Early, Sara E, NP  meloxicam  (MOBIC ) 15 MG tablet Take 15 mg by mouth daily. 02/18/24   [provider]  Multiple Vitamins-Minerals (SENTRY SENIOR PO) Take by mouth.    [provider]  omeprazole  (PRILOSEC) 40 MG capsule Take 1 capsule (40 mg total) by mouth daily. 10/13/24   Early, Sara E, NP  Safety Seal Miscellaneous MISC Apply 1 Application topically in the morning. Medication name: Hormonic Hair Solution (Minoxidil 8%, Finasteride 0.05% and Clobetasol 0.05%) 12/31/24   Alm Delon SAILOR, DO    Family History Family History  Problem Relation Age of Onset   Heart disease Mother    Stroke Sister    Cirrhosis Brother     Social History Social History[1]   Allergies   Ofloxacin   Review of Systems Review of Systems  Constitutional:  Negative for fever.  HENT:  Positive for dental problem and facial swelling. Negative for sore throat and trouble swallowing.   Neurological:   Positive for headaches.  Psychiatric/Behavioral:  Positive for sleep disturbance.   All other systems reviewed and are negative.    Physical Exam Triage Vital Signs ED Triage Vitals  Encounter Vitals Group     BP 01/05/25 1309 (!) 150/83     Girls Systolic BP Percentile --      Girls Diastolic BP Percentile --      Boys Systolic BP Percentile --      Boys Diastolic BP Percentile --      Pulse Rate 01/05/25 1309 81     Resp 01/05/25 1309 18     Temp 01/05/25 1309 98.5 F (36.9 C)     Temp Source 01/05/25 1309 Oral  SpO2 01/05/25 1309 96 %     Weight 01/05/25 1309 226 lb (102.5 kg)     Height 01/05/25 1309 5' 9.5 (1.765 m)     Head Circumference --      Peak Flow --      Pain Score 01/05/25 1318 10     Pain Loc --      Pain Education --      Exclude from Growth Chart --    No data found.  Updated Vital Signs BP (!) 150/83 (BP Location: Right Arm)   Pulse 81   Temp 98.5 F (36.9 C) (Oral)   Resp 18   Ht 5' 9.5 (1.765 m)   Wt 226 lb (102.5 kg)   SpO2 96%   BMI 32.90 kg/m   Visual Acuity Right Eye Distance:   Left Eye Distance:   Bilateral Distance:    Right Eye Near:   Left Eye Near:    Bilateral Near:     Physical Exam Vitals reviewed.  Constitutional:      General: She is awake. She is not in acute distress.    Appearance: Normal appearance. She is well-developed. She is not ill-appearing, toxic-appearing or diaphoretic.  HENT:     Head: Normocephalic.     Jaw: There is normal jaw occlusion. Tenderness and swelling present. No trismus, pain on movement or malocclusion.     Comments: Right lower facial swelling and tenderness     Right Ear: Hearing normal.     Left Ear: Hearing normal.     Nose: Nose normal.     Mouth/Throat:     Lips: No lesions.     Mouth: Mucous membranes are moist. No oral lesions.     Dentition: Gingival swelling present. No dental caries, dental abscesses or gum lesions.     Tongue: No lesions.     Palate: No lesions.      Pharynx: Oropharynx is clear. Uvula midline.      Comments: Perigingival swelling and erythema surrounding the right lower posterior third molar. The second molar in this area is absent. The surrounding gingiva is swollen with a small, rounded ulceration noted on the surface of the gum, which is tender to palpation. No fluctuance or active drainage is appreciated. No additional gingival lesions, dental abscesses, or other dental abnormalities noted. Eyes:     General: Vision grossly intact.     Conjunctiva/sclera: Conjunctivae normal.  Cardiovascular:     Rate and Rhythm: Normal rate and regular rhythm.     Heart sounds: Normal heart sounds.  Pulmonary:     Effort: Pulmonary effort is normal.     Breath sounds: Normal breath sounds and air entry.  Musculoskeletal:        General: Normal range of motion.     Cervical back: Full passive range of motion without pain, normal range of motion and neck supple.  Lymphadenopathy:     Cervical: No cervical adenopathy.  Skin:    General: Skin is warm and dry.  Neurological:     General: No focal deficit present.     Mental Status: She is alert and oriented to person, place, and time.  Psychiatric:        Speech: Speech normal.        Behavior: Behavior is cooperative.      UC Treatments / Results  Labs (all labs ordered are listed, but only abnormal results are displayed) Labs Reviewed - No data to display  EKG   Radiology  No results found.  Procedures Procedures (including critical care time)  Medications Ordered in UC Medications  cefTRIAXone  (ROCEPHIN ) injection 1 g (1 g Intramuscular Given 01/05/25 1432)  ketorolac  (TORADOL ) injection 60 mg (60 mg Intramuscular Given 01/05/25 1432)  lidocaine  (XYLOCAINE ) 2 % viscous mouth solution 15 mL (15 mLs Mouth/Throat Given 01/05/25 1433)    Initial Impression / Assessment and Plan / UC Course  I have reviewed the triage vital signs and the nursing notes.  Pertinent labs & imaging  results that were available during my care of the patient were reviewed by me and considered in my medical decision making (see chart for details).     The patient presents with right-sided mouth pain and facial swelling consistent with a dental infection. Examination reveals perigingival swelling and erythema surrounding the right lower posterior third molar, with absence of the adjacent second molar. The gingiva is swollen with a small, rounded ulceration that is tender to palpation, without fluctuance or active drainage. Right lower facial swelling and tenderness are present. There are no fevers, signs of systemic illness, or airway compromise. The patient appears nontoxic but is experiencing significant discomfort. Findings are most consistent with an odontogenic infection without a drainable abscess at this time.  Treatment was initiated with Augmentin  twice daily for seven days to address infection. Additional therapies were prescribed for pain control and local symptom relief, including Peridex  antibacterial mouth rinse with proper use instructions, ibuprofen  800 mg as needed, viscous lidocaine  swish and spit for oral pain, and oxycodone  for severe pain not controlled with ibuprofen  or acetaminophen . In the clinic, the patient received intramuscular Rocephin  and Toradol , as well as viscous lidocaine , with symptomatic improvement. Supportive care measures and dietary modifications were reviewed. Dental referral options were provided, as definitive dental evaluation and treatment are required.  The patient was advised to follow up with a dentist as soon as possible and with her primary care provider if symptoms are not improving. She was instructed to seek emergency care immediately for worsening facial swelling, fever, difficulty swallowing or breathing, inability to open the mouth, spreading redness, or increasing pain despite treatment.  Today's evaluation has revealed no signs of a dangerous  process. Discussed diagnosis with patient and/or guardian. Patient and/or guardian aware of their diagnosis, possible red flag symptoms to watch out for and need for close follow up. Patient and/or guardian understands verbal and written discharge instructions. Patient and/or guardian comfortable with plan and disposition.  Patient and/or guardian has a clear mental status at this time, good insight into illness (after discussion and teaching) and has clear judgment to make decisions regarding their care  Documentation was completed with the aid of voice recognition software. Transcription may contain typographical errors.  Final Clinical Impressions(s) / UC Diagnoses   Final diagnoses:  Dentalgia  Dental infection  Right facial swelling     Discharge Instructions      You were seen today for dental pain. It is very important that you schedule an appointment with a dentist as soon as possible for complete treatment. You have been given information for several clinics that accept Medicaid or offer affordable options--please call and make an appointment with whichever can see you the soonest.  Take the prescribed antibiotic exactly as directed to help treat and prevent infection. Use the prescribed mouth rinse twice daily after brushing to keep the area clean; do not rinse, eat, or drink for at least 30 minutes afterward. After meals, rinse your mouth with warm water to reduce  irritation.  For pain and inflammation, take the prescribed ibuprofen  (Advil ) medication as directed. Do not use aspirin or other over-the-counter NSAIDs while on this medicine. Tylenol  may also be used for additional relief: take two 500 mg tablets (1,000 mg) every six hours as needed, not exceeding 4,000 mg in 24 hours. Use the oxycodone  sparingly for severe pain that may not be relieved with the ibuprofen  or tylenol . The lidocaine  solution can be used every hour as needed for mouth pain.   Avoid foods or drinks that  worsen your pain. Stick to soft foods, avoid sticky, hard, or crunchy foods. Avoid acidic and spicy foods as well. Do not use straws while your mouth is healing.      ED Prescriptions     Medication Sig Dispense Auth. Provider   amoxicillin -clavulanate (AUGMENTIN ) 875-125 MG tablet Take 1 tablet by mouth 2 (two) times daily after a meal. 14 tablet Raelea Gosse, Lucie, FNP   ibuprofen  (ADVIL ) 800 MG tablet Take 1 tablet (800 mg total) by mouth every 8 (eight) hours as needed (pain). Take with food to avoid stomach upset. Do not take any additional NSAIDs while on this. You may take tylenol  in addition to this if needed for extra pain relief. 21 tablet Rajesh Wyss, [place], FNP   chlorhexidine  (PERIDEX ) 0.12 % solution Use as directed 15 mLs in the mouth or throat 2 (two) times daily at 8 am and 6 pm for 7 days. Brush teeth then swish in mouth for 2 minutes then spit out. Do not rinse mouth after use. Avoid eating, drinking, smoking and vaping for at least 30 minutes after use 210 mL Ashad Fawbush, FNP   oxyCODONE  (ROXICODONE ) 5 MG immediate release tablet Take 1 tablet (5 mg total) by mouth every 4 (four) hours as needed for severe pain (pain score 7-10). 12 tablet Iola Lucie, FNP   lidocaine  (XYLOCAINE ) 2 % solution Swish with 15 mL every hour as needed for mouth pain. Spit out - do not swallow. Avoid eating or drinking for at least 15 minutes after use. 200 mL Iola Lucie, FNP      I have reviewed the PDMP during this encounter.     [1]  Social History Tobacco Use   Smoking status: Never   Smokeless tobacco: Never  Substance Use Topics   Alcohol use: No   Drug use: No     Iola Lucie, FNP 01/05/25 1502  "

## 2025-01-05 NOTE — Discharge Instructions (Addendum)
 You were seen today for dental pain. It is very important that you schedule an appointment with a dentist as soon as possible for complete treatment. You have been given information for several clinics that accept Medicaid or offer affordable options--please call and make an appointment with whichever can see you the soonest.  Take the prescribed antibiotic exactly as directed to help treat and prevent infection. Use the prescribed mouth rinse twice daily after brushing to keep the area clean; do not rinse, eat, or drink for at least 30 minutes afterward. After meals, rinse your mouth with warm water to reduce irritation.  For pain and inflammation, take the prescribed ibuprofen  (Advil ) medication as directed. Do not use aspirin or other over-the-counter NSAIDs while on this medicine. Tylenol  may also be used for additional relief: take two 500 mg tablets (1,000 mg) every six hours as needed, not exceeding 4,000 mg in 24 hours. Use the oxycodone  sparingly for severe pain that may not be relieved with the ibuprofen  or tylenol . The lidocaine  solution can be used every hour as needed for mouth pain.   Avoid foods or drinks that worsen your pain. Stick to soft foods, avoid sticky, hard, or crunchy foods. Avoid acidic and spicy foods as well. Do not use straws while your mouth is healing.

## 2025-01-05 NOTE — ED Triage Notes (Signed)
 Pt c/o dental abscess on right side of mouth for two weeks. States it has gotten worse since Wednesday.

## 2025-01-09 ENCOUNTER — Telehealth: Payer: Self-pay | Admitting: Nurse Practitioner

## 2025-01-09 NOTE — Telephone Encounter (Signed)
 Copied from CRM [id number]. Topic: General - Other >> Jan 08, 2025  3:56 PM Edsel HERO wrote: Patient would like a callback from her pcp. Please advise.  Left message for pt to call me back, no information was obtained on initial phone call

## 2025-01-13 ENCOUNTER — Encounter: Payer: Self-pay | Admitting: Obstetrics and Gynecology

## 2025-01-13 ENCOUNTER — Ambulatory Visit: Admitting: Obstetrics and Gynecology

## 2025-01-13 VITALS — BP 122/60 | HR 84 | Temp 98.0°F | Wt 217.0 lb

## 2025-01-13 DIAGNOSIS — R9389 Abnormal findings on diagnostic imaging of other specified body structures: Secondary | ICD-10-CM

## 2025-01-13 DIAGNOSIS — N950 Postmenopausal bleeding: Secondary | ICD-10-CM

## 2025-01-13 DIAGNOSIS — R7989 Other specified abnormal findings of blood chemistry: Secondary | ICD-10-CM

## 2025-01-13 NOTE — Progress Notes (Signed)
 "  64 y.o. G0P0000 female with postmenopausal bleeding, thickened uterine lining, elevated testosterone  levels here for results discussion. Widowed.  No LMP recorded. Patient is postmenopausal.   12/22/25: She reports rare spotting since October. Last occurred a few weeks ago. Intermittent RLQ pain, last felt this morning, 6/10, self resolving pain Worse during TVUS along right pelvis  Today, she reports increased bleeding. Wearing liner everyday, not saturating. Seeing small clots. No CP or SOB. Lost sister earlier this month 2/2 CHF. She had been declining for some time. Lost husband 1 yr ago, living with niece since husband passed. Has tooth infection, on antibiotics for this, prescribed in ED 01/05/25 She denies chest pain and shortness of breath.  10/13/24 testosterone  85 12/22/24 testosterone  91  Last mammogram: 03/14/23 BIRADS 1  10/20/24 TVUS: UTERUS: Uterus measures 9.7 x 4.8 x 5.5 cm. Uterine fibroid poorly visualized.   ENDOMETRIAL STRIPE: Endometrial measures  1cm. Thickened.   FREE FLUID: No free fluid.   IMPRESSION: 1. Thickened endometrium. In a postmenopausal patient underlying malignancy not excluded. Recommend gynecologic consultation. 2. Poorly visualized known uterine fibroids. 3. Bilateral ovaries are not visualized.  GYN HISTORY: Endometriosis, fibroids  OB History  Gravida Para Term Preterm AB Living  0 0 0 0 0 0  SAB IAB Ectopic Multiple Live Births  0 0 0 0    Past Medical History:  Diagnosis Date   Allergy    History reviewed. No pertinent surgical history. Medications Ordered Prior to Encounter[1] Allergies[2]    PE Today's Vitals   01/13/25 1438  BP: 122/60  Pulse: 84  Temp: 98 F (36.7 C)  TempSrc: Oral  SpO2: 98%  Weight: 217 lb (98.4 kg)   Body mass index is 31.59 kg/m.  Physical Exam Vitals reviewed.  Constitutional:      General: She is not in acute distress.    Appearance: Normal appearance.  HENT:     Head:  Normocephalic and atraumatic.     Nose: Nose normal.  Eyes:     Extraocular Movements: Extraocular movements intact.     Conjunctiva/sclera: Conjunctivae normal.  Cardiovascular:     Rate and Rhythm: Normal rate and regular rhythm.     Heart sounds: No murmur heard.    No friction rub. No gallop.  Pulmonary:     Effort: Pulmonary effort is normal. No respiratory distress.     Breath sounds: Normal breath sounds. No stridor. No wheezing, rhonchi or rales.  Musculoskeletal:        General: Normal range of motion.     Cervical back: Normal range of motion.  Neurological:     General: No focal deficit present.     Mental Status: She is alert.  Psychiatric:        Mood and Affect: Mood normal.        Behavior: Behavior normal.      Assessment and Plan:        Elevated testosterone  level Assessment & Plan: Normal estradiol , PRL and DHEA-s Recommend 17-OHP and imaging of ovaries, discussed repeat imaging with ultrasound or proceeding with MRI She would like to proceed with MRI at this time.  Testosterone  is not high enough for adrenal imaging at this time.  Orders: -     17-Hydroxyprogesterone -     MR PELVIS W WO CONTRAST; Future  Thickened endometrium  Post-menopausal bleeding Assessment & Plan: Surgical clearance received from PCP 01/08/25. Again offered in office EMB vs hysteroscopy. Patient desires diagnostic hysteroscopy, D&C, possible polypectomy. Will  proceed with scheduling. Preop exam completed today.  Discussed outpatient procedure. Reviewed that  recovery is usually 1-2 days. Risks including infections, bleeding, and damage to surrounding organs reviewed. Recommend NPO prior to midnight and reviewed medication to take on day of surgery. Dicussed use of NSAIDS as needed for pain postoperatively.  Preop checklist: Antibiotics: none DVT ppx: SCDs Postop visit: 1 week Additional clearance: medical clearance, completed 01/08/25    Orders: -     MR PELVIS W WO  CONTRAST; Future   35 minutes  total time was spent for this patient encounter, including preparation, face-to-face counseling with the patient, coordination of care, and documentation of the encounter.    Vera LULLA Pa, MD         [1]  Current Outpatient Medications on File Prior to Visit  Medication Sig Dispense Refill   amLODipine (NORVASC) 5 MG tablet Take 5 mg by mouth daily.     amoxicillin -clavulanate (AUGMENTIN ) 875-125 MG tablet Take 1 tablet by mouth 2 (two) times daily after a meal. 14 tablet 0   atorvastatin  (LIPITOR) 10 MG tablet Take 1 tablet (10 mg total) by mouth daily. 30 tablet 2   Calcium -Magnesium-Vitamin D  185-50-100 MG-MG-UNIT CAPS Take 1 tablet by mouth.     chlorhexidine  (PERIDEX ) 0.12 % solution Use as directed 15 mLs in the mouth or throat 2 (two) times daily at 8 am and 6 pm for 7 days. Brush teeth then swish in mouth for 2 minutes then spit out. Do not rinse mouth after use. Avoid eating, drinking, smoking and vaping for at least 30 minutes after use 210 mL 0   diclofenac (VOLTAREN) 75 MG EC tablet Take 75 mg by mouth 2 (two) times daily.     gabapentin  (NEURONTIN ) 300 MG capsule Take 1 capsule (300 mg total) by mouth at bedtime as needed. 30 capsule 1   hydrOXYzine  (ATARAX ) 10 MG tablet TAKE 1 TABLET BY MOUTH EVERY 6 HOURS AS NEEDED FOR ANXIETY 90 tablet 0   ibuprofen  (ADVIL ) 800 MG tablet Take 1 tablet (800 mg total) by mouth every 8 (eight) hours as needed (pain). Take with food to avoid stomach upset. Do not take any additional NSAIDs while on this. You may take tylenol  in addition to this if needed for extra pain relief. 21 tablet 0   levocetirizine (XYZAL ) 5 MG tablet Take 1 tablet (5 mg total) by mouth every evening. 90 tablet 1   lidocaine  (XYLOCAINE ) 2 % solution Swish with 15 mL every hour as needed for mouth pain. Spit out - do not swallow. Avoid eating or drinking for at least 15 minutes after use. 200 mL 0   meloxicam  (MOBIC ) 15 MG tablet Take 15  mg by mouth daily.     Multiple Vitamins-Minerals (SENTRY SENIOR PO) Take by mouth.     omeprazole  (PRILOSEC) 40 MG capsule Take 1 capsule (40 mg total) by mouth daily. 90 capsule 3   oxyCODONE  (ROXICODONE ) 5 MG immediate release tablet Take 1 tablet (5 mg total) by mouth every 4 (four) hours as needed for severe pain (pain score 7-10). 12 tablet 0   Safety Seal Miscellaneous MISC Apply 1 Application topically in the morning. Medication name: Hormonic Hair Solution (Minoxidil 8%, Finasteride 0.05% and Clobetasol 0.05%) 30 mL 6   No current facility-administered medications on file prior to visit.  [2]  Allergies Allergen Reactions   Ofloxacin Rash and Dermatitis    Eye drops   "

## 2025-01-13 NOTE — Patient Instructions (Signed)
 Preoperative instructions: Nothing to eat or drink after midnight, unless instructed differently regarding clear liquids by the anesthesia team at [facility]. Do not take any medications on the day of surgery, except those listed below: Prilosec Norvasc Please follow all other instructions as provided by our surgical scheduler at [facility] and the anesthesia team at [facility].  Postoperative instructions: Take ibuprofen  as prescribed and over-the-counter Tylenol  as needed. (DO NOT TAKE IBUPROFEN  IF YOU HAVE CONTRAINDICATIONS THAT MAY INCLUDE USE OF BLOOD THINNERS, HISTORY STOMACH SURGERY OR GASTRITIS, CHRONIC KIDNEY DISEASE, ALLERGY, PRIOR INSTRUCTION TO AVOID IBUPROFEN -LIKE MEDICATIONS.)

## 2025-01-13 NOTE — Assessment & Plan Note (Addendum)
 Normal estradiol , PRL and DHEA-s Recommend 17-OHP and imaging of ovaries, discussed repeat imaging with ultrasound or proceeding with MRI She would like to proceed with MRI at this time.  Testosterone  is not high enough for adrenal imaging at this time.

## 2025-01-13 NOTE — Assessment & Plan Note (Signed)
 Surgical clearance received from PCP 01/08/25. Again offered in office EMB vs hysteroscopy. Patient desires diagnostic hysteroscopy, D&C, possible polypectomy. Will proceed with scheduling. Preop exam completed today.  Discussed outpatient procedure. Reviewed that  recovery is usually 1-2 days. Risks including infections, bleeding, and damage to surrounding organs reviewed. Recommend NPO prior to midnight and reviewed medication to take on day of surgery. Dicussed use of NSAIDS as needed for pain postoperatively.  Preop checklist: Antibiotics: none DVT ppx: SCDs Postop visit: 1 week Additional clearance: medical clearance, completed 01/08/25

## 2025-01-13 NOTE — H&P (View-Only) (Signed)
 "  64 y.o. G0P0000 female with postmenopausal bleeding, thickened uterine lining, elevated testosterone  levels here for results discussion. Widowed.  No LMP recorded. Patient is postmenopausal.   12/22/25: She reports rare spotting since October. Last occurred a few weeks ago. Intermittent RLQ pain, last felt this morning, 6/10, self resolving pain Worse during TVUS along right pelvis  Today, she reports increased bleeding. Wearing liner everyday, not saturating. Seeing small clots. No CP or SOB. Lost sister earlier this month 2/2 CHF. She had been declining for some time. Lost husband 1 yr ago, living with niece since husband passed. Has tooth infection, on antibiotics for this, prescribed in ED 01/05/25 She denies chest pain and shortness of breath.  10/13/24 testosterone  85 12/22/24 testosterone  91  Last mammogram: 03/14/23 BIRADS 1  10/20/24 TVUS: UTERUS: Uterus measures 9.7 x 4.8 x 5.5 cm. Uterine fibroid poorly visualized.   ENDOMETRIAL STRIPE: Endometrial measures  1cm. Thickened.   FREE FLUID: No free fluid.   IMPRESSION: 1. Thickened endometrium. In a postmenopausal patient underlying malignancy not excluded. Recommend gynecologic consultation. 2. Poorly visualized known uterine fibroids. 3. Bilateral ovaries are not visualized.  GYN HISTORY: Endometriosis, fibroids  OB History  Gravida Para Term Preterm AB Living  0 0 0 0 0 0  SAB IAB Ectopic Multiple Live Births  0 0 0 0    Past Medical History:  Diagnosis Date   Allergy    History reviewed. No pertinent surgical history. Medications Ordered Prior to Encounter[1] Allergies[2]    PE Today's Vitals   01/13/25 1438  BP: 122/60  Pulse: 84  Temp: 98 F (36.7 C)  TempSrc: Oral  SpO2: 98%  Weight: 217 lb (98.4 kg)   Body mass index is 31.59 kg/m.  Physical Exam Vitals reviewed.  Constitutional:      General: She is not in acute distress.    Appearance: Normal appearance.  HENT:     Head:  Normocephalic and atraumatic.     Nose: Nose normal.  Eyes:     Extraocular Movements: Extraocular movements intact.     Conjunctiva/sclera: Conjunctivae normal.  Cardiovascular:     Rate and Rhythm: Normal rate and regular rhythm.     Heart sounds: No murmur heard.    No friction rub. No gallop.  Pulmonary:     Effort: Pulmonary effort is normal. No respiratory distress.     Breath sounds: Normal breath sounds. No stridor. No wheezing, rhonchi or rales.  Musculoskeletal:        General: Normal range of motion.     Cervical back: Normal range of motion.  Neurological:     General: No focal deficit present.     Mental Status: She is alert.  Psychiatric:        Mood and Affect: Mood normal.        Behavior: Behavior normal.      Assessment and Plan:        Elevated testosterone  level Assessment & Plan: Normal estradiol , PRL and DHEA-s Recommend 17-OHP and imaging of ovaries, discussed repeat imaging with ultrasound or proceeding with MRI She would like to proceed with MRI at this time.  Testosterone  is not high enough for adrenal imaging at this time.  Orders: -     17-Hydroxyprogesterone -     MR PELVIS W WO CONTRAST; Future  Thickened endometrium  Post-menopausal bleeding Assessment & Plan: Surgical clearance received from PCP 01/08/25. Again offered in office EMB vs hysteroscopy. Patient desires diagnostic hysteroscopy, D&C, possible polypectomy. Will  proceed with scheduling. Preop exam completed today.  Discussed outpatient procedure. Reviewed that  recovery is usually 1-2 days. Risks including infections, bleeding, and damage to surrounding organs reviewed. Recommend NPO prior to midnight and reviewed medication to take on day of surgery. Dicussed use of NSAIDS as needed for pain postoperatively.  Preop checklist: Antibiotics: none DVT ppx: SCDs Postop visit: 1 week Additional clearance: medical clearance, completed 01/08/25    Orders: -     MR PELVIS W WO  CONTRAST; Future   35 minutes  total time was spent for this patient encounter, including preparation, face-to-face counseling with the patient, coordination of care, and documentation of the encounter.    Vera LULLA Pa, MD         [1]  Current Outpatient Medications on File Prior to Visit  Medication Sig Dispense Refill   amLODipine (NORVASC) 5 MG tablet Take 5 mg by mouth daily.     amoxicillin -clavulanate (AUGMENTIN ) 875-125 MG tablet Take 1 tablet by mouth 2 (two) times daily after a meal. 14 tablet 0   atorvastatin  (LIPITOR) 10 MG tablet Take 1 tablet (10 mg total) by mouth daily. 30 tablet 2   Calcium -Magnesium-Vitamin D  185-50-100 MG-MG-UNIT CAPS Take 1 tablet by mouth.     chlorhexidine  (PERIDEX ) 0.12 % solution Use as directed 15 mLs in the mouth or throat 2 (two) times daily at 8 am and 6 pm for 7 days. Brush teeth then swish in mouth for 2 minutes then spit out. Do not rinse mouth after use. Avoid eating, drinking, smoking and vaping for at least 30 minutes after use 210 mL 0   diclofenac (VOLTAREN) 75 MG EC tablet Take 75 mg by mouth 2 (two) times daily.     gabapentin  (NEURONTIN ) 300 MG capsule Take 1 capsule (300 mg total) by mouth at bedtime as needed. 30 capsule 1   hydrOXYzine  (ATARAX ) 10 MG tablet TAKE 1 TABLET BY MOUTH EVERY 6 HOURS AS NEEDED FOR ANXIETY 90 tablet 0   ibuprofen  (ADVIL ) 800 MG tablet Take 1 tablet (800 mg total) by mouth every 8 (eight) hours as needed (pain). Take with food to avoid stomach upset. Do not take any additional NSAIDs while on this. You may take tylenol  in addition to this if needed for extra pain relief. 21 tablet 0   levocetirizine (XYZAL ) 5 MG tablet Take 1 tablet (5 mg total) by mouth every evening. 90 tablet 1   lidocaine  (XYLOCAINE ) 2 % solution Swish with 15 mL every hour as needed for mouth pain. Spit out - do not swallow. Avoid eating or drinking for at least 15 minutes after use. 200 mL 0   meloxicam  (MOBIC ) 15 MG tablet Take 15  mg by mouth daily.     Multiple Vitamins-Minerals (SENTRY SENIOR PO) Take by mouth.     omeprazole  (PRILOSEC) 40 MG capsule Take 1 capsule (40 mg total) by mouth daily. 90 capsule 3   oxyCODONE  (ROXICODONE ) 5 MG immediate release tablet Take 1 tablet (5 mg total) by mouth every 4 (four) hours as needed for severe pain (pain score 7-10). 12 tablet 0   Safety Seal Miscellaneous MISC Apply 1 Application topically in the morning. Medication name: Hormonic Hair Solution (Minoxidil 8%, Finasteride 0.05% and Clobetasol 0.05%) 30 mL 6   No current facility-administered medications on file prior to visit.  [2]  Allergies Allergen Reactions   Ofloxacin Rash and Dermatitis    Eye drops   "

## 2025-01-17 ENCOUNTER — Other Ambulatory Visit: Payer: Self-pay | Admitting: Nurse Practitioner

## 2025-01-17 DIAGNOSIS — F5101 Primary insomnia: Secondary | ICD-10-CM

## 2025-01-20 ENCOUNTER — Ambulatory Visit: Payer: Self-pay | Admitting: Obstetrics and Gynecology

## 2025-01-20 LAB — 17-HYDROXYPROGESTERONE: 17-OH-Progesterone, LC/MS/MS: 37 ng/dL

## 2025-01-22 ENCOUNTER — Encounter: Payer: Self-pay | Admitting: *Deleted

## 2025-01-26 ENCOUNTER — Ambulatory Visit (HOSPITAL_COMMUNITY)
Admission: RE | Admit: 2025-01-26 | Discharge: 2025-01-26 | Disposition: A | Source: Ambulatory Visit | Attending: Obstetrics and Gynecology

## 2025-01-26 DIAGNOSIS — R7989 Other specified abnormal findings of blood chemistry: Secondary | ICD-10-CM

## 2025-01-26 DIAGNOSIS — N950 Postmenopausal bleeding: Secondary | ICD-10-CM

## 2025-01-26 MED ORDER — GADOBUTROL 1 MMOL/ML IV SOLN
10.0000 mL | Freq: Once | INTRAVENOUS | Status: AC | PRN
  Administered 2025-01-26: 10 mL via INTRAVENOUS

## 2025-01-28 ENCOUNTER — Encounter (HOSPITAL_COMMUNITY): Payer: Self-pay | Admitting: Obstetrics and Gynecology

## 2025-01-28 NOTE — Progress Notes (Addendum)
 Spoke w/ via phone for pre-op interview--- pt Lab needs dos---- bmp/ EKG (pt is postmenopausal, ?upt)        Lab results------ no COVID test -----patient states asymptomatic no test needed Arrive at ------- 0830 NPO after MN  Pre-Surgery Ensure or G2: n/a  Med rec completed Medications to take morning of surgery ----- norvasc/ prilosec Diabetic medication ----- n/a  GLP1 agonist last dose: n/a GLP1 instructions:  Patient instructed no nail polish to be worn day of surgery Patient instructed to bring photo id and insurance card day of surgery Patient aware to have Driver (ride ) / caregiver    for 24 hours after surgery - niece, shannon Patient Special Instructions ----- will do dial soap shower night before surgery Pre-Op special Instructions ----- pt's brother passed away this past Feb 12, 2025 ( house fire) and her sister died 2024-12-31.   Patient verbalized understanding of instructions that were given at this phone interview. Patient denies chest pain, sob, fever, cough at the interview.

## 2025-01-30 ENCOUNTER — Ambulatory Visit (HOSPITAL_COMMUNITY): Admitting: Anesthesiology

## 2025-01-30 ENCOUNTER — Ambulatory Visit (HOSPITAL_COMMUNITY)
Admission: RE | Admit: 2025-01-30 | Discharge: 2025-01-30 | Disposition: A | Discharge status: Home / Self Care | Source: Home / Self Care | Attending: Obstetrics and Gynecology | Admitting: Obstetrics and Gynecology

## 2025-01-30 ENCOUNTER — Other Ambulatory Visit: Payer: Self-pay

## 2025-01-30 ENCOUNTER — Ambulatory Visit: Payer: Self-pay | Admitting: Obstetrics and Gynecology

## 2025-01-30 ENCOUNTER — Encounter (HOSPITAL_COMMUNITY): Payer: Self-pay | Admitting: Obstetrics and Gynecology

## 2025-01-30 ENCOUNTER — Encounter (HOSPITAL_COMMUNITY)
Admission: RE | Disposition: A | Discharge status: Home / Self Care | Payer: Self-pay | Source: Home / Self Care | Attending: Obstetrics and Gynecology

## 2025-01-30 DIAGNOSIS — Z01818 Encounter for other preprocedural examination: Secondary | ICD-10-CM

## 2025-01-30 DIAGNOSIS — R9389 Abnormal findings on diagnostic imaging of other specified body structures: Secondary | ICD-10-CM | POA: Diagnosis not present

## 2025-01-30 DIAGNOSIS — N950 Postmenopausal bleeding: Secondary | ICD-10-CM | POA: Diagnosis not present

## 2025-01-30 HISTORY — DX: Essential (primary) hypertension: I10

## 2025-01-30 HISTORY — DX: Prediabetes: R73.03

## 2025-01-30 HISTORY — DX: Gastro-esophageal reflux disease without esophagitis: K21.9

## 2025-01-30 HISTORY — DX: Presence of spectacles and contact lenses: Z97.3

## 2025-01-30 HISTORY — DX: Abnormal findings on diagnostic imaging of other specified body structures: R93.89

## 2025-01-30 HISTORY — DX: Postmenopausal bleeding: N95.0

## 2025-01-30 HISTORY — DX: Other chronic pain: G89.29

## 2025-01-30 HISTORY — DX: Leiomyoma of uterus, unspecified: D25.9

## 2025-01-30 HISTORY — DX: Unspecified osteoarthritis, unspecified site: M19.90

## 2025-01-30 MED ORDER — ONDANSETRON HCL 4 MG/2ML IJ SOLN
INTRAMUSCULAR | Status: AC
  Filled 2025-01-30: qty 2

## 2025-01-30 MED ORDER — LIDOCAINE HCL (PF) 1 % IJ SOLN
INTRAMUSCULAR | Status: DC | PRN
  Administered 2025-01-30: 10 mL

## 2025-01-30 MED ORDER — CELECOXIB 200 MG PO CAPS
ORAL_CAPSULE | ORAL | Status: AC
  Filled 2025-01-30: qty 2

## 2025-01-30 MED ORDER — PROPOFOL 10 MG/ML IV BOLUS
INTRAVENOUS | Status: DC | PRN
  Administered 2025-01-30: 20 mg via INTRAVENOUS
  Administered 2025-01-30: 30 mg via INTRAVENOUS
  Administered 2025-01-30 (×2): 40 mg via INTRAVENOUS
  Administered 2025-01-30: 75 ug/kg/min via INTRAVENOUS

## 2025-01-30 MED ORDER — DEXAMETHASONE SOD PHOSPHATE PF 10 MG/ML IJ SOLN
INTRAMUSCULAR | Status: DC | PRN
  Administered 2025-01-30: 5 mg via INTRAVENOUS

## 2025-01-30 MED ORDER — PROPOFOL 10 MG/ML IV BOLUS
INTRAVENOUS | Status: AC
  Filled 2025-01-30: qty 20

## 2025-01-30 MED ORDER — GLYCOPYRROLATE PF 0.2 MG/ML IJ SOSY
PREFILLED_SYRINGE | INTRAMUSCULAR | Status: DC | PRN
  Administered 2025-01-30: .1 mg via INTRAVENOUS

## 2025-01-30 MED ORDER — CHLORHEXIDINE GLUCONATE 0.12 % MT SOLN
OROMUCOSAL | Status: AC
  Filled 2025-01-30: qty 15

## 2025-01-30 MED ORDER — DEXAMETHASONE SOD PHOSPHATE PF 10 MG/ML IJ SOLN
INTRAMUSCULAR | Status: AC
  Filled 2025-01-30: qty 1

## 2025-01-30 MED ORDER — LIDOCAINE 2% (20 MG/ML) 5 ML SYRINGE
INTRAMUSCULAR | Status: DC | PRN
  Administered 2025-01-30: 50 mg via INTRAVENOUS

## 2025-01-30 MED ORDER — LACTATED RINGERS IV SOLN: INTRAVENOUS | Status: DC

## 2025-01-30 MED ORDER — DEXMEDETOMIDINE HCL IN NACL 80 MCG/20ML IV SOLN
INTRAVENOUS | Status: DC | PRN
  Administered 2025-01-30: 4 ug via INTRAVENOUS

## 2025-01-30 MED ORDER — FENTANYL CITRATE (PF) 250 MCG/5ML IJ SOLN
INTRAMUSCULAR | Status: DC | PRN
  Administered 2025-01-30: 50 ug via INTRAVENOUS

## 2025-01-30 MED ORDER — PHENYLEPHRINE 80 MCG/ML (10ML) SYRINGE FOR IV PUSH (FOR BLOOD PRESSURE SUPPORT)
PREFILLED_SYRINGE | INTRAVENOUS | Status: DC | PRN
  Administered 2025-01-30: 80 ug via INTRAVENOUS

## 2025-01-30 MED ORDER — OXYCODONE HCL 5 MG PO TABS: 5.0000 mg | ORAL_TABLET | Freq: Once | ORAL | Status: DC | PRN

## 2025-01-30 MED ORDER — MIDAZOLAM HCL 2 MG/2ML IJ SOLN
INTRAMUSCULAR | Status: AC
  Filled 2025-01-30: qty 2

## 2025-01-30 MED ORDER — CELECOXIB 200 MG PO CAPS
400.0000 mg | ORAL_CAPSULE | ORAL | Status: AC
  Administered 2025-01-30: 400 mg via ORAL

## 2025-01-30 MED ORDER — OXYCODONE HCL 5 MG/5ML PO SOLN: 5.0000 mg | Freq: Once | ORAL | Status: DC | PRN

## 2025-01-30 MED ORDER — LIDOCAINE 2% (20 MG/ML) 5 ML SYRINGE
INTRAMUSCULAR | Status: AC
  Filled 2025-01-30: qty 5

## 2025-01-30 MED ORDER — FENTANYL CITRATE (PF) 100 MCG/2ML IJ SOLN
INTRAMUSCULAR | Status: AC
  Filled 2025-01-30: qty 2

## 2025-01-30 MED ORDER — ORAL CARE MOUTH RINSE: 15.0000 mL | Freq: Once | OROMUCOSAL | Status: AC

## 2025-01-30 MED ORDER — MIDAZOLAM HCL (PF) 2 MG/2ML IJ SOLN
INTRAMUSCULAR | Status: DC | PRN
  Administered 2025-01-30: 1 mg via INTRAVENOUS

## 2025-01-30 MED ORDER — GLYCOPYRROLATE PF 0.2 MG/ML IJ SOSY
PREFILLED_SYRINGE | INTRAMUSCULAR | Status: AC
  Filled 2025-01-30: qty 1

## 2025-01-30 MED ORDER — SODIUM CHLORIDE 0.9 % IR SOLN
Status: DC | PRN
  Administered 2025-01-30: 1000 mL

## 2025-01-30 MED ORDER — ACETAMINOPHEN 500 MG PO TABS
ORAL_TABLET | ORAL | Status: AC
  Filled 2025-01-30: qty 2

## 2025-01-30 MED ORDER — ACETAMINOPHEN 500 MG PO TABS
1000.0000 mg | ORAL_TABLET | ORAL | Status: AC
  Administered 2025-01-30: 1000 mg via ORAL

## 2025-01-30 MED ORDER — ONDANSETRON HCL 4 MG/2ML IJ SOLN
INTRAMUSCULAR | Status: DC | PRN
  Administered 2025-01-30: 4 mg via INTRAVENOUS

## 2025-01-30 MED ORDER — CHLORHEXIDINE GLUCONATE 0.12 % MT SOLN
15.0000 mL | Freq: Once | OROMUCOSAL | Status: AC
  Administered 2025-01-30: 15 mL via OROMUCOSAL

## 2025-01-30 MED ORDER — FENTANYL CITRATE (PF) 100 MCG/2ML IJ SOLN: 25.0000 ug | INTRAMUSCULAR | Status: DC | PRN

## 2025-01-30 NOTE — Anesthesia Procedure Notes (Signed)
 Procedure Name: MAC Date/Time: 01/30/2025 10:48 AM  Performed by: Erick Fitz, CRNAPre-anesthesia Checklist: Patient identified, Emergency Drugs available, Suction available, Patient being monitored and Timeout performed Patient Re-evaluated:Patient Re-evaluated prior to induction Oxygen Delivery Method: Simple face mask Preoxygenation: Pre-oxygenation with 100% oxygen Induction Type: IV induction Placement Confirmation: positive ETCO2 and CO2 detector Dental Injury: Teeth and Oropharynx as per pre-operative assessment

## 2025-01-30 NOTE — Interval H&P Note (Signed)
 History and Physical Interval Note:  01/30/2025 10:25 AM  [name]  has presented today for surgery, with the diagnosis of Thickened endometrium, PMB.  The various methods of treatment have been discussed with the patient and family. After consideration of risks, benefits and other options for treatment, the patient has consented to  Procedures with comments: DILATATION & CURETTAGE/HYSTEROSCOPY WITH RESECTOCOPE (N/A) - Myosure, polypectomy MYOSURE RESECTION (N/A) as a surgical intervention.  The patient's history has been reviewed, patient examined, no change in status, stable for surgery.  I have reviewed the patient's chart and labs.  Questions were answered to the patient's satisfaction.     Jodilyn Giese LULLA Pa

## 2025-01-30 NOTE — Anesthesia Preprocedure Evaluation (Signed)
 "                                  Anesthesia Evaluation  Patient identified by MRN, date of birth, ID band Patient awake    Reviewed: Allergy & Precautions, NPO status , Patient's Chart, lab work & pertinent test results  History of Anesthesia Complications Negative for: history of anesthetic complications  Airway Mallampati: II  TM Distance: >3 FB Neck ROM: Full    Dental  (+) Dental Advisory Given   Pulmonary neg shortness of breath, neg sleep apnea, neg COPD, neg recent URI   breath sounds clear to auscultation       Cardiovascular hypertension, Pt. on medications (-) angina (-) Past MI and (-) CHF (-) dysrhythmias  Rhythm:Regular     Neuro/Psych neg Seizures negative neurological ROS  negative psych ROS   GI/Hepatic Neg liver ROS,GERD  Medicated and Controlled,,  Endo/Other  negative endocrine ROS    Renal/GU negative Renal ROS     Musculoskeletal  (+) Arthritis ,    Abdominal   Peds  Hematology negative hematology ROS (+) Lab Results      Component                Value               Date                      WBC                      6.8                 12/29/2024                HGB                      14.1                12/29/2024                HCT                      44.0                12/29/2024                MCV                      92                  12/29/2024                PLT                      345                 12/29/2024               Anesthesia Other Findings   Reproductive/Obstetrics                              Anesthesia Physical Anesthesia Plan  ASA: 2  Anesthesia Plan: MAC   Post-op Pain Management: Tylenol  PO (pre-op)* and Celebrex  PO (pre-op)*   Induction: Intravenous  PONV Risk  Score and Plan: 3 and Ondansetron , Dexamethasone  and Propofol  infusion  Airway Management Planned: Nasal Cannula, Simple Face Mask and Natural Airway  Additional Equipment: None  Intra-op Plan:    Post-operative Plan:   Informed Consent: I have reviewed the patients History and Physical, chart, labs and discussed the procedure including the risks, benefits and alternatives for the proposed anesthesia with the patient or authorized representative who has indicated his/her understanding and acceptance.     Dental advisory given  Plan Discussed with: CRNA  Anesthesia Plan Comments:         Anesthesia Quick Evaluation  "

## 2025-01-30 NOTE — Op Note (Signed)
 01/30/25 Karna Dutch [id number]  OPERATIVE REPORT  Preop Diagnosis: Pre-Op Diagnosis Codes:     * Thickened endometrium [R93.89]    * PMB (postmenopausal bleeding) [N95.0]   Post operative diagnosis: same  Procedure: Procedures: DILATATION & CURETTAGE/HYSTEROSCOPY WITH RESECTOCOPE, POLYPECTOMY  Surgeon: Vera LULLA Pa, MD Assistant: none  Anesthesia: MAC Fluids: please see anesthesia report Fluid deficit: see OR report  Complications: None  Findings: Normal cervix. Uterine cavity measuring 9cm with both ostia seen. Endometrial polyps were noted.   Estimated blood loss: Minimal  Specimens:  ID Type Source Tests Collected by Time Destination  1 : endometrial biopsies GYN Endometrium SURGICAL PATHOLOGY Pa Vera LULLA, MD 01/30/2025 1108   2 : endometrium currettings GYN Endometrium Curettage SURGICAL PATHOLOGY Pa Vera LULLA, MD 01/30/2025 1109     Disposition of specimen: Pathology    Procedure: Patient was taken to the OR where she was placed in dorsal lithotomy in stirrups. She voided prior to transport. SCDs were in place.  The patient was prepped and draped in the usual sterile fashion. An adequate timeout was obtained and everyone agreed. A bivalve speculum was placed inside the vagina and the cervix visualized. The cervix was grasped anteriorly with a single-tooth tenaculum. Paracervical block was performed with 1% lidocaine .  The uterus sounded to 9 cm. Sequential cervical dilation was done to 33fr, and the myosure hysteroscope was introduced into the uterine cavity.  Findings as noted. At least 4 polyps were seen and completely morcellated using Myosure. The myosure was also used to sample the entire cavity.  A sharp curettage was then performed to ensure complete sampling of the cavity and was sent to pathology. All instrument, sponge and lap counts were correct x2. The patient was awakened taken to recovery room in stable condition.  Vera LULLA Pa, MD 01/30/25   11:23 AM

## 2025-01-30 NOTE — Transfer of Care (Signed)
 Immediate Anesthesia Transfer of Care Note  Patient: [name]  Procedure(s) Performed: DILATATION & CURETTAGE/HYSTEROSCOPY WITH RESECTOCOPE MYOSURE RESECTION  Patient Location: PACU  Anesthesia Type:MAC  Level of Consciousness: awake, alert , oriented, and patient cooperative  Airway & Oxygen Therapy: Patient Spontanous Breathing and Patient connected to face mask oxygen  Post-op Assessment: Report given to RN and Post -op Vital signs reviewed and stable  Post vital signs: Reviewed and stable  Last Vitals:  Vitals Value Taken Time  BP 130/79 01/30/25 11:23  Temp 36.6 C 01/30/25 11:23  Pulse 76 01/30/25 11:28  Resp 18 01/30/25 11:28  SpO2 99 % 01/30/25 11:28  Vitals shown include unfiled device data.  Last Pain:  Vitals:   01/30/25 1123  TempSrc:   PainSc: 0-No pain      Patients Stated Pain Goal: 5 (01/30/25 0857)  Complications: There were no known notable events for this encounter.

## 2025-02-09 ENCOUNTER — Encounter: Admitting: Obstetrics and Gynecology
